# Patient Record
Sex: Male | Born: 1950 | Race: White | Hispanic: No | Smoking: Former smoker
Health system: Southern US, Community
[De-identification: ages and names within clinical notes are randomized; demographics above are authoritative.]

## PROBLEM LIST (undated history)

## (undated) DIAGNOSIS — S82401A Unspecified fracture of shaft of right fibula, initial encounter for closed fracture: Secondary | ICD-10-CM

## (undated) DIAGNOSIS — T7840XA Allergy, unspecified, initial encounter: Secondary | ICD-10-CM

## (undated) DIAGNOSIS — F32A Depression, unspecified: Secondary | ICD-10-CM

## (undated) DIAGNOSIS — M1990 Unspecified osteoarthritis, unspecified site: Secondary | ICD-10-CM

## (undated) DIAGNOSIS — L409 Psoriasis, unspecified: Secondary | ICD-10-CM

## (undated) DIAGNOSIS — F329 Major depressive disorder, single episode, unspecified: Secondary | ICD-10-CM

## (undated) DIAGNOSIS — G4730 Sleep apnea, unspecified: Secondary | ICD-10-CM

## (undated) DIAGNOSIS — G4733 Obstructive sleep apnea (adult) (pediatric): Secondary | ICD-10-CM

## (undated) DIAGNOSIS — S82301A Unspecified fracture of lower end of right tibia, initial encounter for closed fracture: Secondary | ICD-10-CM

## (undated) DIAGNOSIS — I10 Essential (primary) hypertension: Secondary | ICD-10-CM

## (undated) HISTORY — PX: COLONOSCOPY: SHX174

## (undated) HISTORY — DX: Major depressive disorder, single episode, unspecified: F32.9

## (undated) HISTORY — DX: Essential (primary) hypertension: I10

## (undated) HISTORY — DX: Obstructive sleep apnea (adult) (pediatric): G47.33

## (undated) HISTORY — DX: Sleep apnea, unspecified: G47.30

## (undated) HISTORY — DX: Allergy, unspecified, initial encounter: T78.40XA

## (undated) HISTORY — DX: Unspecified osteoarthritis, unspecified site: M19.90

## (undated) HISTORY — DX: Psoriasis, unspecified: L40.9

## (undated) HISTORY — PX: REPLACEMENT TOTAL KNEE: SUR1224

## (undated) HISTORY — DX: Depression, unspecified: F32.A

---

## 1971-02-28 HISTORY — PX: NASAL TURBINATE REDUCTION: SHX2072

## 1994-06-26 ENCOUNTER — Encounter: Payer: Self-pay | Admitting: Internal Medicine

## 1998-07-29 ENCOUNTER — Ambulatory Visit (HOSPITAL_COMMUNITY): Admission: RE | Admit: 1998-07-29 | Discharge: 1998-07-29 | Payer: Self-pay | Admitting: Obstetrics and Gynecology

## 1998-07-29 ENCOUNTER — Encounter: Payer: Self-pay | Admitting: Obstetrics and Gynecology

## 2002-12-15 ENCOUNTER — Encounter: Admission: RE | Admit: 2002-12-15 | Discharge: 2002-12-15 | Payer: Self-pay | Admitting: Dermatology

## 2002-12-15 ENCOUNTER — Encounter: Payer: Self-pay | Admitting: Dermatology

## 2003-10-19 ENCOUNTER — Ambulatory Visit (HOSPITAL_COMMUNITY): Admission: RE | Admit: 2003-10-19 | Discharge: 2003-10-19 | Payer: Self-pay | Admitting: Orthopedic Surgery

## 2003-10-19 ENCOUNTER — Ambulatory Visit (HOSPITAL_BASED_OUTPATIENT_CLINIC_OR_DEPARTMENT_OTHER): Admission: RE | Admit: 2003-10-19 | Discharge: 2003-10-19 | Payer: Self-pay | Admitting: Orthopedic Surgery

## 2005-02-12 ENCOUNTER — Encounter: Admission: RE | Admit: 2005-02-12 | Discharge: 2005-02-12 | Payer: Self-pay | Admitting: Orthopedic Surgery

## 2007-02-28 HISTORY — PX: ROTATOR CUFF REPAIR: SHX139

## 2007-08-05 ENCOUNTER — Encounter (INDEPENDENT_AMBULATORY_CARE_PROVIDER_SITE_OTHER): Payer: Self-pay | Admitting: Sports Medicine

## 2007-08-05 ENCOUNTER — Ambulatory Visit: Payer: Self-pay

## 2007-08-23 ENCOUNTER — Telehealth (INDEPENDENT_AMBULATORY_CARE_PROVIDER_SITE_OTHER): Payer: Self-pay | Admitting: *Deleted

## 2007-08-26 ENCOUNTER — Inpatient Hospital Stay (HOSPITAL_COMMUNITY): Admission: RE | Admit: 2007-08-26 | Discharge: 2007-08-28 | Payer: Self-pay | Admitting: Orthopedic Surgery

## 2008-08-06 ENCOUNTER — Ambulatory Visit: Payer: Self-pay | Admitting: Internal Medicine

## 2008-08-06 DIAGNOSIS — M1990 Unspecified osteoarthritis, unspecified site: Secondary | ICD-10-CM | POA: Insufficient documentation

## 2008-08-06 DIAGNOSIS — F329 Major depressive disorder, single episode, unspecified: Secondary | ICD-10-CM

## 2008-08-06 DIAGNOSIS — I10 Essential (primary) hypertension: Secondary | ICD-10-CM

## 2008-08-06 DIAGNOSIS — R7309 Other abnormal glucose: Secondary | ICD-10-CM

## 2008-08-06 DIAGNOSIS — R9431 Abnormal electrocardiogram [ECG] [EKG]: Secondary | ICD-10-CM

## 2008-08-06 LAB — CONVERTED CEMR LAB
ALT: 29 units/L (ref 0–53)
AST: 26 units/L (ref 0–37)
Albumin: 4 g/dL (ref 3.5–5.2)
Alkaline Phosphatase: 77 units/L (ref 39–117)
BUN: 24 mg/dL — ABNORMAL HIGH (ref 6–23)
Basophils Absolute: 0.1 10*3/uL (ref 0.0–0.1)
Basophils Relative: 1.4 % (ref 0.0–3.0)
Bilirubin Urine: NEGATIVE
Bilirubin, Direct: 0.1 mg/dL (ref 0.0–0.3)
CO2: 27 meq/L (ref 19–32)
Calcium: 8.9 mg/dL (ref 8.4–10.5)
Chloride: 110 meq/L (ref 96–112)
Creatinine, Ser: 1.5 mg/dL (ref 0.4–1.5)
Eosinophils Absolute: 0.1 10*3/uL (ref 0.0–0.7)
Eosinophils Relative: 1.6 % (ref 0.0–5.0)
GFR calc non Af Amer: 51.25 mL/min (ref >60)
Glucose, Bld: 126 mg/dL — ABNORMAL HIGH (ref 70–99)
HCT: 43 % (ref 39.0–52.0)
Hemoglobin, Urine: NEGATIVE
Hemoglobin: 15.2 g/dL (ref 13.0–17.0)
Hgb A1c MFr Bld: 5.6 % (ref 4.6–6.5)
Ketones, ur: NEGATIVE mg/dL
Leukocytes, UA: NEGATIVE
Lymphocytes Relative: 28.6 % (ref 12.0–46.0)
Lymphs Abs: 1.7 10*3/uL (ref 0.7–4.0)
MCHC: 35.3 g/dL (ref 30.0–36.0)
MCV: 89.8 fL (ref 78.0–100.0)
Monocytes Absolute: 0.5 10*3/uL (ref 0.1–1.0)
Monocytes Relative: 8.2 % (ref 3.0–12.0)
Neutro Abs: 3.6 10*3/uL (ref 1.4–7.7)
Neutrophils Relative %: 60.2 % (ref 43.0–77.0)
Nitrite: NEGATIVE
Platelets: 184 10*3/uL (ref 150.0–400.0)
Potassium: 3.9 meq/L (ref 3.5–5.1)
RBC: 4.79 M/uL (ref 4.22–5.81)
RDW: 12.8 % (ref 11.5–14.6)
Sodium: 142 meq/L (ref 135–145)
Specific Gravity, Urine: >=1.03 (ref 1.000–1.030)
TSH: 1.51 microintl units/mL (ref 0.35–5.50)
Total Bilirubin: 0.8 mg/dL (ref 0.3–1.2)
Total Protein, Urine: NEGATIVE mg/dL
Total Protein: 7 g/dL (ref 6.0–8.3)
Urine Glucose: NEGATIVE mg/dL
Urobilinogen, UA: 0.2 (ref 0.0–1.0)
WBC: 6 10*3/uL (ref 4.5–10.5)
pH: 5.5 (ref 5.0–8.0)

## 2008-08-14 ENCOUNTER — Encounter: Payer: Self-pay | Admitting: Internal Medicine

## 2008-08-19 ENCOUNTER — Telehealth (INDEPENDENT_AMBULATORY_CARE_PROVIDER_SITE_OTHER): Payer: Self-pay | Admitting: *Deleted

## 2008-08-20 ENCOUNTER — Ambulatory Visit: Payer: Self-pay

## 2008-08-20 ENCOUNTER — Encounter: Payer: Self-pay | Admitting: Cardiology

## 2008-08-24 ENCOUNTER — Ambulatory Visit: Payer: Self-pay

## 2008-08-24 ENCOUNTER — Ambulatory Visit: Payer: Self-pay | Admitting: Internal Medicine

## 2008-08-25 ENCOUNTER — Encounter: Payer: Self-pay | Admitting: Internal Medicine

## 2008-08-26 ENCOUNTER — Encounter: Payer: Self-pay | Admitting: Internal Medicine

## 2008-08-28 ENCOUNTER — Telehealth: Payer: Self-pay | Admitting: Internal Medicine

## 2008-09-03 ENCOUNTER — Telehealth: Payer: Self-pay | Admitting: Internal Medicine

## 2008-09-07 ENCOUNTER — Encounter: Payer: Self-pay | Admitting: Internal Medicine

## 2008-09-07 ENCOUNTER — Telehealth: Payer: Self-pay | Admitting: Internal Medicine

## 2008-09-14 ENCOUNTER — Inpatient Hospital Stay (HOSPITAL_COMMUNITY): Admission: RE | Admit: 2008-09-14 | Discharge: 2008-09-16 | Payer: Self-pay | Admitting: Orthopedic Surgery

## 2008-09-14 ENCOUNTER — Telehealth: Payer: Self-pay | Admitting: Internal Medicine

## 2008-09-29 ENCOUNTER — Telehealth: Payer: Self-pay | Admitting: Internal Medicine

## 2008-11-16 ENCOUNTER — Ambulatory Visit: Payer: Self-pay | Admitting: Internal Medicine

## 2008-11-16 LAB — CONVERTED CEMR LAB
BUN: 24 mg/dL — ABNORMAL HIGH (ref 6–23)
Bilirubin Urine: NEGATIVE
CO2: 24 meq/L (ref 19–32)
Calcium: 9 mg/dL (ref 8.4–10.5)
Chloride: 108 meq/L (ref 96–112)
Creatinine, Ser: 1.3 mg/dL (ref 0.4–1.5)
GFR calc non Af Amer: 60.39 mL/min (ref >60)
Glucose, Bld: 96 mg/dL (ref 70–99)
Hemoglobin, Urine: NEGATIVE
Hgb A1c MFr Bld: 5.1 % (ref 4.6–6.5)
Ketones, ur: NEGATIVE mg/dL
Leukocytes, UA: NEGATIVE
Nitrite: NEGATIVE
Potassium: 4.1 meq/L (ref 3.5–5.1)
Sodium: 140 meq/L (ref 135–145)
Specific Gravity, Urine: >=1.03 (ref 1.000–1.030)
Total Protein, Urine: NEGATIVE mg/dL
Urine Glucose: NEGATIVE mg/dL
Urobilinogen, UA: 0.2 (ref 0.0–1.0)
pH: 5.5 (ref 5.0–8.0)

## 2008-12-21 ENCOUNTER — Ambulatory Visit: Payer: Self-pay | Admitting: Internal Medicine

## 2008-12-21 DIAGNOSIS — G4733 Obstructive sleep apnea (adult) (pediatric): Secondary | ICD-10-CM

## 2008-12-29 ENCOUNTER — Telehealth: Payer: Self-pay | Admitting: Internal Medicine

## 2008-12-30 ENCOUNTER — Encounter: Payer: Self-pay | Admitting: Internal Medicine

## 2009-02-02 ENCOUNTER — Encounter: Payer: Self-pay | Admitting: Internal Medicine

## 2009-02-08 ENCOUNTER — Telehealth: Payer: Self-pay | Admitting: Internal Medicine

## 2009-06-17 ENCOUNTER — Telehealth: Payer: Self-pay | Admitting: Internal Medicine

## 2009-08-17 ENCOUNTER — Telehealth: Payer: Self-pay | Admitting: Internal Medicine

## 2009-09-23 ENCOUNTER — Telehealth: Payer: Self-pay | Admitting: Internal Medicine

## 2009-11-15 ENCOUNTER — Telehealth: Payer: Self-pay | Admitting: Internal Medicine

## 2009-12-21 ENCOUNTER — Ambulatory Visit: Payer: Self-pay | Admitting: Internal Medicine

## 2009-12-21 DIAGNOSIS — L408 Other psoriasis: Secondary | ICD-10-CM | POA: Insufficient documentation

## 2009-12-21 DIAGNOSIS — G4700 Insomnia, unspecified: Secondary | ICD-10-CM | POA: Insufficient documentation

## 2009-12-22 ENCOUNTER — Encounter: Payer: Self-pay | Admitting: Internal Medicine

## 2010-03-29 NOTE — Progress Notes (Signed)
Summary: lorazepam  Phone Note Refill Request   Refills Requested: Medication #1:  LORAZEPAM 0.5 MG TABS 1-2 by mouth two times a day as needed anxiety or insomnia   Supply Requested: 3 months Initial call taken by: Lamar Sprinkles, CMA,  June 17, 2009 10:51 AM  Follow-up for Phone Call        ok Follow-up by: Etta Grandchild MD,  June 17, 2009 11:07 AM  Additional Follow-up for Phone Call Additional follow up Details #1::        Rx called to pharmacy, spoke wuith alex/pharmacist Additional Follow-up by: Orlan Leavens,  June 17, 2009 2:33 PM    Prescriptions: LORAZEPAM 0.5 MG TABS (LORAZEPAM) 1-2 by mouth two times a day as needed anxiety or insomnia  #50 x 2   Entered by:   Orlan Leavens   Authorized by:   Etta Grandchild MD   Signed by:   Orlan Leavens on 06/17/2009   Method used:   Telephoned to ...       [facility]  [address] [id number]       [address]       [place], [place]  [address]       Ph: [phone] or [phone]       Fax: [phone]   RxID:   [id number]

## 2010-03-29 NOTE — Progress Notes (Signed)
  Phone Note Refill Request Message from:  Fax from Pharmacy on September 23, 2009 4:00 PM  Refills Requested: Medication #1:  LORAZEPAM 0.5 MG TABS 1-2 by mouth two times a day as needed anxiety or insomnia   Dosage confirmed as above?Dosage Confirmed   Supply Requested: 1 month   Last Refilled: 08/19/2009  Is this ok to refill for pt  [facility] [address]   Method Requested: Telephone to Pharmacy Initial call taken by: Rock Nephew CMA,  September 23, 2009 4:01 PM  Follow-up for Phone Call        yes Follow-up by: Etta Grandchild MD,  September 23, 2009 4:28 PM

## 2010-03-29 NOTE — Progress Notes (Signed)
Summary: RF  Phone Note Refill Request   Refills Requested: Medication #1:  LORAZEPAM 0.5 MG TABS 1-2 by mouth two times a day as needed anxiety or insomnia Patient is requesting refill for 90 day supply. Wants #90, he takes only one daily. OK?   Initial call taken by: Lamar Sprinkles, CMA,  November 15, 2009 11:55 AM  Follow-up for Phone Call        ok Follow-up by: Etta Grandchild MD,  November 15, 2009 12:27 PM  Additional Follow-up for Phone Call Additional follow up Details #1::        Pt informed  Additional Follow-up by: Lamar Sprinkles, CMA,  November 15, 2009 3:25 PM    Prescriptions: LORAZEPAM 0.5 MG TABS (LORAZEPAM) 1-2 by mouth two times a day as needed anxiety or insomnia  #90 x 0   Entered by:   Lamar Sprinkles, CMA   Authorized by:   Etta Grandchild MD   Signed by:   Lamar Sprinkles, CMA on 11/15/2009   Method used:   Telephoned to ...       [facility]  [address] [id number]       [address]       [place], [place]  [address]       Ph: [phone] or [phone]       Fax: [phone]   RxID:   [id number]

## 2010-03-29 NOTE — Assessment & Plan Note (Signed)
Summary: 12 months/apc   Primary Brenon Antosh/Referring Rafferty Postlewait:  Etta Grandchild MD  CC:  1 year follow up, complaiant with cpap averages 6 hrs per night, pt c/o waking 2 or 3 times a night, and pt has been out of lorazepam x 3 monthsr.  History of Present Illness: History of Present Illness: December 21, 2008- 58 yoM started CPAP for sleep apnea years ago at the old office. He thinks pressure is on 13. Uses CPAP everynight and he is not told he snores through. He denies daytime sleepiness. Now his old cpap is worn out and difficult to travel with.  NPSG 06/26/94- RDI 35/hr Bedtime 10-11PM, sleep latency 15 minutes, up 3-4 times before waking 532-6AM.  Has lost 20 lb in recent years, Sleeps on Tempurpedic mattress. Has hypertension, no cardiac, neurologic or thyroid disorder. Has had ENT surgery- turbinate reduction.  December 21, 2009- OSA Nurse-CC: 1 year follow up, compliant with cpap averages 6 hrs per night, pt c/o waking 2 or 3 times a night, pt has been out of lorazepam x 3 months. He has trouble getting it refilled on time. He was taking Lorazepam 1/2 x 0.5 mg in the morning with other meds. He doesn't feel he needs it in daytime and asks if he could change to take it for sleep consolidation at night. He has been waking spontaneously 2-3 x/ night then returns readily to sleep. Bedtime 10PM, up at 5AM. Feels tired w/ slow starts in AM.  CPAP pressure remains at 13. He thinks CPAP is fine and wife isn't hearing snore or noting leg jerks.  Declines flu vax.      Preventive Screening-Counseling & Management  Alcohol-Tobacco     Smoking Status: quit     Packs/Day: 0.75     Year Started: 1968     Year Quit: 1999  Current Medications (verified): 1)  Lisinopril-Hydrochlorothiazide 20-12.5 Mg Tabs (Lisinopril-Hydrochlorothiazide) .... Take 1 Tablet By Mouth Once A Day 2)  Lorazepam 0.5 Mg Tabs (Lorazepam) .Marland Kitchen.. 1-2 By Mouth Two Times A Day As Needed Anxiety or Insomnia 3)  Meloxicam 15 Mg  Tabs (Meloxicam) .... Take 1 Tablet By Mouth Once A Day 4)  Enbrel 50 Mg/ml Soln (Etanercept) .... Twice Weekly 5)  Wellbutrin Xl 150 Mg Xr24h-Tab (Bupropion Hcl) .... 3 Once Daily 6)  Cpap .... Set 13 Ahc  Allergies (verified): No Known Drug Allergies  Past History:  Past Medical History: Last updated: 12/21/2008 Depression Hypertension OSA- NPSG 06/26/94- RDI 35/hr Psoriasis Osteoarthritis  Past Surgical History: Last updated: 12/21/2008 Total knee replacement-x 2 Rotator cuff repair Nasal turbinate reduction  Family History: Last updated: 12/21/2008 Family History of Arthritis Family History Hypertension Family History Lung cancer Father- died lymphoma, emphysema  Social History: Last updated: 12/21/2008 Occupation: Merchandiser, retail [employer] Married Alcohol use-yes Drug use-no Regular exercise-no Patient states former smoker. Quit 1999  Risk Factors: Alcohol Use: <1 (08/06/2008) >5 drinks/d w/in last 3 months: no (08/06/2008) Exercise: no (08/06/2008)  Risk Factors: Smoking Status: quit (12/21/2009) Packs/Day: 0.75 (12/21/2009)  Social History: Packs/Day:  0.75  Review of Systems      See HPI  The patient denies shortness of breath with activity, shortness of breath at rest, productive cough, non-productive cough, coughing up blood, chest pain, irregular heartbeats, acid heartburn, indigestion, loss of appetite, weight change, abdominal pain, difficulty swallowing, sore throat, tooth/dental problems, headaches, nasal congestion/difficulty breathing through nose, and sneezing.    Vital Signs:  Patient profile:   60 year old male Height:  75 inches Weight:      351.6 pounds BMI:     44.11 O2 Sat:      98 % on Room air Pulse rate:   76 / minute BP sitting:   138 / 78  (left arm) Cuff size:   large  Vitals Entered By: Renold Genta RCP, LPN (December 21, 2009 9:21 AM)  O2 Flow:  Room air CC: 1 year follow up, complaiant with cpap averages 6 hrs  per night, pt c/o waking 2 or 3 times a night, pt has been out of lorazepam x 3 monthsr Is Patient Diabetic? No Comments Medications reviewed with patient Renold Genta RCP, LPN  December 21, 2009 9:26 AM    Physical Exam  Additional Exam:  General: A/Ox3; pleasant and cooperative, NAD, obese, tall SKIN: no rash, lesions NODES: no lymphadenopathy HEENT: [place]/AT, EOM- WNL, Conjuctivae- clear, PERRLA, TM-WNL, Nose- clear, Throat- clear and wnl, Mallampati  III-IV NECK: Supple w/ fair ROM, JVD- none, normal carotid impulses w/o bruits Thyroid- normal to palpation CHEST: Clear to P&A HEART: RRR, no m/g/r heard ABDOMEN: Soft and nl;  UVO:ZDGU, nl pulses, no edema  NEURO: Grossly intact to observation      Impression & Recommendations:  Problem # 1:  HYPERSOMNIA, ASSOCIATED WITH SLEEP APNEA (ICD-780.53)  Clinically his CPAP compliance and control seem good. We will ask [facility] -handwritten- to do a card download so we can see where we are. We can change the pressure if needed. Weight loss would help.   Problem # 2:  INSOMNIA (ICD-780.52)  There is an insomnia component with waking after sleep onset. I am not sure it will need much, and we discussed sleep hygiene. Since he has had lorazepam, originally prescribed for anxiety/ depression and then for sleep, I will start with that to see if it helps with sleep consolidation as discussed.   Problem # 3:  PSORIASIS (ICD-696.1)  He remains on immunosupressive treatment for his psoriasis. This has improved his comfort and is not obvioulsy impacting sleep now.   Orders: Est. Patient Level IV (44034)  Medications Added to Medication List This Visit: 1)  Lorazepam 0.5 Mg Tabs (Lorazepam) .Marland Kitchen.. 1-2  at bedtime if needed for insomnia  Patient Instructions: 1)  Please schedule a follow-up appointment in 6 months. 2)  Ask [facility] to help update a download on on your CPAP for pressure reassessment. 3)  Script for lorazepam to help with sleep.  Don't take it if you don't need it- that will delay getting tolerant to where it doesn't work as well.  Prescriptions: LORAZEPAM 0.5 MG TABS (LORAZEPAM) 1-2  at bedtime if needed for insomnia  #180 x 3   Entered and Authorized by:   Waymon Budge MD   Signed by:   Waymon Budge MD on 12/21/2009   Method used:   Print then Give to Patient   RxID:   [id number]

## 2010-03-29 NOTE — Progress Notes (Signed)
Summary: 90day supply corrections  ---- Converted from flag ---- ---- 08/17/2009 9:35 AM, Etta Grandchild MD wrote: ok  ---- 08/17/2009 8:30 AM, Rock Nephew CMA wrote: Per pharmacy, pt states he takes Take 1 tablet by mouth once a day. #50 does not last pt 90days. Is it ok to fill 50 for a 50day supply ------------------------------  Phone Note From Pharmacy   Summary of Call: pharmacist notified via fax per MD ok to update #50tabs to last [facility]day[phone] Initial call taken by: Rock Nephew CMA,  August 17, 2009 11:39 AM

## 2010-06-03 ENCOUNTER — Other Ambulatory Visit: Payer: Self-pay | Admitting: Internal Medicine

## 2010-06-05 LAB — DIFFERENTIAL
Basophils Absolute: 0 10*3/uL (ref 0.0–0.1)
Lymphocytes Relative: 27 % (ref 12–46)
Neutro Abs: 4.6 10*3/uL (ref 1.7–7.7)
Neutrophils Relative %: 62 % (ref 43–77)

## 2010-06-05 LAB — BASIC METABOLIC PANEL
BUN: 27 mg/dL — ABNORMAL HIGH (ref 6–23)
CO2: 26 mEq/L (ref 19–32)
CO2: 27 mEq/L (ref 19–32)
Calcium: 8.2 mg/dL — ABNORMAL LOW (ref 8.4–10.5)
Chloride: 103 mEq/L (ref 96–112)
Creatinine, Ser: 1.66 mg/dL — ABNORMAL HIGH (ref 0.4–1.5)
GFR calc Af Amer: 52 mL/min — ABNORMAL LOW (ref >60)
GFR calc non Af Amer: 43 mL/min — ABNORMAL LOW (ref >60)
GFR calc non Af Amer: 56 mL/min — ABNORMAL LOW (ref >60)
Glucose, Bld: 111 mg/dL — ABNORMAL HIGH (ref 70–99)
Glucose, Bld: 112 mg/dL — ABNORMAL HIGH (ref 70–99)
Potassium: 4.1 mEq/L (ref 3.5–5.1)
Potassium: 4.6 mEq/L (ref 3.5–5.1)
Sodium: 136 mEq/L (ref 135–145)
Sodium: 136 mEq/L (ref 135–145)

## 2010-06-05 LAB — COMPREHENSIVE METABOLIC PANEL
Albumin: 4.1 g/dL (ref 3.5–5.2)
BUN: 27 mg/dL — ABNORMAL HIGH (ref 6–23)
CO2: 26 mEq/L (ref 19–32)
Chloride: 105 mEq/L (ref 96–112)
Creatinine, Ser: 1.68 mg/dL — ABNORMAL HIGH (ref 0.4–1.5)
GFR calc non Af Amer: 42 mL/min — ABNORMAL LOW (ref >60)
Glucose, Bld: 97 mg/dL (ref 70–99)
Total Bilirubin: 0.4 mg/dL (ref 0.3–1.2)

## 2010-06-05 LAB — CBC
HCT: 33.3 % — ABNORMAL LOW (ref 39.0–52.0)
HCT: 36.8 % — ABNORMAL LOW (ref 39.0–52.0)
HCT: 44.5 % (ref 39.0–52.0)
Hemoglobin: 11.5 g/dL — ABNORMAL LOW (ref 13.0–17.0)
Hemoglobin: 12.7 g/dL — ABNORMAL LOW (ref 13.0–17.0)
MCHC: 34.4 g/dL (ref 30.0–36.0)
MCV: 91.2 fL (ref 78.0–100.0)
MCV: 90.8 fL (ref 78.0–100.0)
Platelets: 163 10*3/uL (ref 150–400)
Platelets: 192 10*3/uL (ref 150–400)
RBC: 4.04 MIL/uL — ABNORMAL LOW (ref 4.22–5.81)
RDW: 13.6 % (ref 11.5–15.5)
RDW: 13.7 % (ref 11.5–15.5)
WBC: 10 10*3/uL (ref 4.0–10.5)
WBC: 7.4 10*3/uL (ref 4.0–10.5)

## 2010-06-05 LAB — URINALYSIS, ROUTINE W REFLEX MICROSCOPIC
Bilirubin Urine: NEGATIVE
Ketones, ur: NEGATIVE mg/dL
Nitrite: NEGATIVE
Protein, ur: NEGATIVE mg/dL

## 2010-06-05 LAB — PROTIME-INR
INR: 1.1 (ref 0.00–1.49)
Prothrombin Time: 15.1 seconds (ref 11.6–15.2)
Prothrombin Time: 14.3 seconds (ref 11.6–15.2)

## 2010-06-05 LAB — APTT: aPTT: 33 seconds (ref 24–37)

## 2010-06-05 LAB — TYPE AND SCREEN
ABO/RH(D): A POS
Antibody Screen: NEGATIVE

## 2010-06-05 LAB — URINE CULTURE: Culture: NO GROWTH

## 2010-06-21 ENCOUNTER — Ambulatory Visit (INDEPENDENT_AMBULATORY_CARE_PROVIDER_SITE_OTHER): Payer: 59 | Admitting: Internal Medicine

## 2010-06-21 ENCOUNTER — Encounter: Payer: Self-pay | Admitting: Internal Medicine

## 2010-06-21 VITALS — BP 116/76 | HR 74 | Ht 75.0 in | Wt 335.8 lb

## 2010-06-21 DIAGNOSIS — G4730 Sleep apnea, unspecified: Secondary | ICD-10-CM

## 2010-06-21 DIAGNOSIS — G4700 Insomnia, unspecified: Secondary | ICD-10-CM

## 2010-06-21 DIAGNOSIS — G4710 Hypersomnia, unspecified: Secondary | ICD-10-CM

## 2010-06-21 NOTE — Assessment & Plan Note (Signed)
Good compliance and control. Weight loss is encouraged. We will continue present pressure for now, but consider a step up in the futrure. Encouraged to get enough sleep.

## 2010-06-21 NOTE — Progress Notes (Signed)
  Subjective:    Patient ID: Steven Martin, male    DOB: [birth year], 60 y.o.   MRN: [id number]  HPI 60 yo M followed for obstructive sleep apnea complicated by insomnia and hypertension. He hasn't needed ativan much at all. He gets up at 5 AM now and has little trouble falling asleep at night around 10-11PM. We discussed metabolic effects of insufficient sleep. He feels comfortable with nasal pillows mask, [facility], 13 cwp. Downloaded today- used 91 of days > 4 hours, with AHI 7.6.    Review of Systems See HPI no tireder than he is used to.    Constitutional:   No weight loss, night sweats,  Fevers, chills, fatigue, lassitude. HEENT:   No headaches,  Difficulty swallowing,  Tooth/dental problems,  Sore throat,                No sneezing, itching, ear ache, nasal congestion, post nasal drip,   CV:  No chest pain,  Orthopnea, PND, swelling in lower extremities, anasarca, dizziness, palpitations  GI  No heartburn, indigestion, abdominal pain, nausea, vomiting, diarrhea, change in bowel habits, loss of appetite  Resp: No shortness of breath with exertion or at rest.  No excess mucus, no productive cough,  No non-productive cough,  No coughing up of blood.  No change in color of mucus.  No wheezing. Skin: no rash or lesions.  GU: no dysuria, change in color of urine, no urgency or frequency.  No flank pain.  MS:  No joint pain or swelling.  No decreased range of motion.  No back pain.  Psych:  No change in mood or affect. No depression or anxiety.  No memory loss.   Objective:   Physical Exam General- Alert, Oriented, Affect-appropriate, Distress- none acute      obese  Skin- , lesions- none, excoriation- none   Psoriasis left knuckles  Lymphadenopathy- none  Head- atraumatic  Eyes- Gross vision intact, PERRLA, conjunctivae clear, secretions  Ears-Normal- Hearing, canals, Tm L ,   R ,  Nose- Clear, No-Septal dev, mucus, polyps, erosion, perforation   Throat- Mallampati IV ,  mucosa clear , drainage- none, tonsils- atrophic  Neck- flexible , trachea midline, no stridor , thyroid nl, carotid no bruit  Chest - symmetrical excursion , unlabored     Heart/CV- RRR , no murmur , no gallop  , no rub, nl s1 s2                     - JVD- none , edema- none, stasis changes- none, varices- none     Lung- clear to P&A, wheeze- none, cough- none , dullness-none, rub- none     Chest wall- Abd- tender-no, distended-no, bowel sounds-present, HSM- no  Br/ Gen/ Rectal- Not done, not indicated  Extrem- cyanosis- none, clubbing, none, atrophy- none, strength- nl  Neuro- grossly intact to observation         Assessment & Plan:

## 2010-06-21 NOTE — Patient Instructions (Signed)
We will continue your CPAP pressure at 13 for now,  But may consider trying to step it up in the future.   CPAP is not a replacement for allowing yourself enough sleep time   Nonsedating antihistamines like claritin or Allegra are fine if needed during allergy season

## 2010-06-23 ENCOUNTER — Encounter: Payer: Self-pay | Admitting: Internal Medicine

## 2010-06-23 NOTE — Assessment & Plan Note (Signed)
Occasional nights when sleep onset is difficult, within adult norms. We discussed sleep hygiene and available support.

## 2010-07-12 NOTE — Op Note (Signed)
NAME:  Steven Martin, Steven Martin NO.:  [id number]   MEDICAL RECORD NO.:  [id number]          PATIENT TYPE:  INP   LOCATION:  NA                           FACILITY:  [facility]   PHYSICIAN:  Robert A. Thurston Hole, M.D. DATE OF BIRTH:  [birth year]   DATE OF PROCEDURE:  08/26/2007  DATE OF DISCHARGE:                               OPERATIVE REPORT   PREOPERATIVE DIAGNOSIS:  Right knee degenerative joint disease.   POSTOPERATIVE DIAGNOSIS:  Right knee degenerative joint disease.   PROCEDURE:  Right total knee replacement using DePuy cemented total knee  system #5, cemented femur #5, cemented tibia with 12.5 mm polyethylene  RP tibial spacer and 35-mm polyethylene cemented patella.   SURGEON:  Nilda Simmer, M.D.   ASSISTANT:  Veverly Fells, P.A.   ANESTHESIA:  General.   OPERATIVE TIME:  1 hour 20 minutes.   COMPLICATIONS:  None.   DESCRIPTION OF PROCEDURE:  Steven Martin was brought into the operating  room on August 26, 2007, after femoral nerve block was placed in the  holding area by anesthesia.  He was placed on the operative table in  supine position.  He received Ancef 2 g IV preoperatively for  prophylaxis.  After being placed under general anesthesia, a Foley  catheter placed under sterile conditions.  His right knee was examined.  Range of motion from -5 to 125 degrees of varus deformity of knee.  Stable ligamentous exam with normal patella tracking.  The right leg was  prepped using sterile DuraPrep and draped using sterile technique.  The  leg was exsanguinated and the thigh-high tourniquet elevated 365 mm.  Initially, through a 15-cm longitudinal incision based over the patella,  initial exposure was made.  The underlying subcutaneous tissues were  incised along with skin incision.  A median arthrotomy was made  revealing an excessive amount of normally-appearing joint fluid.  The  articular surfaces were inspected.  He had grade 4 changes medially,  laterally, and  in the patellofemoral joint.  Osteophytes were removed  from the femoral condyles and tibial plateau.  The medial and lateral  meniscal remnants were removed as well as the anterior cruciate  ligament.  The intramedullary drill was then drilled up the femoral  canal for placement of distal femoral cutting jig, which is placed in  the appropriate manner of rotation and then distally an 11 mm cut was  made.  The distal femur was then sized.  A #5 was found to be the  appropriate size.  A #5 cutting jig was placed in the appropriate manner  of external rotation and then these cuts were made.  The proximal tibia  was then exposed.  The tibial spines were removed with an oscillating  saw.  Each medullary drill was drilled out in the tibial canal after  placement of proximal tibial cutting jig, which is placed in the  appropriate manner of rotation.  A proximal 6 mm cut was made based up  the medial or lower side.  Spacer blocks were then placed in flexion and  extension.  A 12.5 mm blocks  give excellent balancing, excellent  stability, and excellent correction of his flexion and varus  deformities.  At this point, the #5 tibial base plate trial was placed  on the cut tibial surface with an excellent fit and a keel cut was made.  After this was done, the PCL box cutter was placed on the distal femur  and these cut were made.  At this point, the #5 femoral trial placed,  and with the #5 tibia base plate trial and a 12.5 mm polyethylene RP  tibial spacer, the knee was reduced taking through a range of motion  from 0 to 125 degrees with excellent stability and excellent correction  of his flexion and varus deformities and normal patella tracking.  The  patella was incised and a 10 mm resurfacing cut was made and 3 locking  holes were placed for 35 mm patella trial.  The trial was placed.  Again, patellofemoral tracking was evaluated and found to be normal.  At  this point, I felt that all the trial  components have excellent size,  fit, and stability.  They were then removed.  The knee was then jet  lavaged, irrigated with 3 liters of saline.  The proximal tibia was then  exposed and the #5 tibial base plate was cemented back and was hammered  into position with an excellent fit, with excess cement being removed  from around the edges.  A #5 femoral component was cemented back and  hammered into position also with an excellent fit with excess cement  being removed from around the edges.  The 12.5 mm polyethylene RP tibial  spacer was placed on tibial base plate.  The knee reduced taking to full  range of motion, found to be stable, leg lengths equal with excellent  correction with flexion of varus deformities.  The 35-mm polyethylene  cemented back patella was then placed in its position and held there  with a clamp.  After the cement hardened, the knee was further irrigated  with saline.  The tourniquet was then released.  Hemostasis was obtained  with cautery.  It was then felt that all the component were of excellent  size, fit, and stability.  The arthrotomy was then closed with #1  Ethibond suture with 2 medium Hemovac drains.  Subcutaneous tissue was  closed with closed with 0 and 2-0 Vicryl, subcuticular area closed with  4-0 Monocryl.  Sterile dressings and long leg splint applied.  The  patient then awakened, extubated, and taken to the recovery room in  stable condition.  Needle and sponge count was correct x2 at the end of  the case.  At the end of the case, neurovascular status was normal as  well.      Robert A. Thurston Hole, M.D.  Electronically Signed     RAW/MEDQ  D:  08/26/2007  T:  08/26/2007  Job:  [id number]

## 2010-07-12 NOTE — Op Note (Signed)
NAME:  Steven Martin, Steven Martin NO.:  [id number]   MEDICAL RECORD NO.:  [id number]          PATIENT TYPE:  INP   LOCATION:  5036                         FACILITY:  [facility]   PHYSICIAN:  Robert A. Thurston Hole, M.D. DATE OF BIRTH:  08-29-1950   DATE OF PROCEDURE:  09/14/2008  DATE OF DISCHARGE:                               OPERATIVE REPORT   PREOPERATIVE DIAGNOSIS:  Left knee degenerative joint disease.   POSTOPERATIVE DIAGNOSIS:  Left knee degenerative joint disease.   PROCEDURE:  Left total knee replacement using DePuy cemented total knee  system with #5 cemented femur, #5 cemented tibia with 17.5-mm  polyethylene RP tibial spacer and 38-mm polyethylene cemented patella.   SURGEON:  Elana Alm. Thurston Hole, MD   ASSISTANT:  Julien Girt, PA   ANESTHESIA:  General.   OPERATIVE TIME:  1 hour 30 minutes.   COMPLICATIONS:  None.   DESCRIPTION OF PROCEDURE:  Steven Martin was brought to the operating room  on September 14, 2008, after a femoral nerve block was placed in the holding  by anesthesia.  He was placed on the operative table in the supine  position.  He received vancomycin 1 g IV preoperatively for prophylaxis.  After being placed under general anesthesia, he had a Foley catheter  placed under sterile conditions.  His left knee was examined.  Range of  motion from -5 to 125 degrees, moderate varus deformity, knee stable  ligamentous exam with normal patellar tracking.  Left leg was prepped  using sterile DuraPrep and draped using sterile technique.  A time-out  technique was used diligently.  The left leg was then prepped using  sterile DuraPrep and draped.  After being prepped and draped, the left  leg was elevated and exsanguinated and tourniquet was elevated to 365  mm.  Initially, through a 15-cm longitudinal incision based over the  patella, initial exposure was made.  Then, subcutaneous tissues were incised along with skin incision.  A  median arthrotomy was  performed revealing an excessive amount of normal-  appearing joint fluid.  The articular surfaces were inspected.  He had  grade 4 changes medially and laterally in the patellofemoral joint.  Osteophytes were removed from the femoral condyles and tibial plateau.  The medial and lateral meniscal remnants were removed as well as the  anterior cruciate ligament.  An intramedullary drill was then drilled up  the femoral canal for placement of distal femoral cutting jig which was  placed in the appropriate amount of rotation and a distal 13-mm cut was  made.  The distal femur was incised.  The #5 was found to be the  appropriate size.  The #5 cutting jig was then placed in the appropriate  amount of external rotation and then these cuts were made.  The proximal  tibia was then exposed.  The tibial spines were removed with an  oscillating saw.  Intramedullary drill was drilled down the tibial canal  for placement of proximal tibial cutting jig which was placed in the  appropriate amount of rotation and a proximal 6-mm cut was made based  off  the medial or lower side.  Spacer blocks were then placed in flexion  and extension.  The 17.5 mm blocks gave excellent balancing, excellent  stability, and excellent correction of his flexion and varus  deformities.  At this point, the #5 tibial baseplate trial was placed on  the cut tibial surface with an excellent fit and the keel cut was made.  The PCL box cutter was placed on the distal femur and these cuts were  made.  After this was done, the #5 femoral component was placed with the  #5 tibial baseplate component and a 17.5-mm polyethylene RP tibial  spacer.  The knee was reduced and taken through range of motion from 0-  125 degrees with excellent stability and excellent correction of his  flexion and varus deformities and normal patellar tracking.  A  resurfacing 9-mm cut was then made on the patella and 3 locking holes  placed for a 38-mm patellar  trial which was then placed and again  patellofemoral tracking was evaluated and found to be normal.  At this  point, it was felt that all the trial components were of excellent size,  fit, and stability.  They were then removed.  The knee was then jet  lavage irrigated with 3 L of saline.  The proximal tibia was then  exposed.  The #5 tibial baseplate with cemented back and was hammered  into position with an excellent fit with excess cement being removed  from around the edges.  The #5 femoral component with cement backing was  hammered into position, also with an excellent fit with excess cement  being removed from around the edges.  The 17.5-mm polyethylene RP tibial  spacer was placed on tibial baseplate.  The knee was reduced and taken  through range of motion from 0-125 degrees with excellent stability and  excellent correction of his flexion and varus deformities.  The 38-mm  polyethylene cement backed patella was then placed in its position and  held there with a clamp.  After the cement hardened, again  patellofemoral tracking was evaluated and found to be normal.  At this  point, it was felt that all the components were of excellent size, fit,  and stability.  The wound was further irrigated with saline and then the  tourniquet was released.  Hemostasis was obtained with cautery.  The  arthrotomy was then closed with #1 Ethibond suture over 2 medium Hemovac  drains.  Subcutaneous tissues were closed with 0 and 2-0 Vicryl.  Subcuticular layer was closed with 4-0 Monocryl.  Sterile dressings and  a long-leg splint were applied.  Then, the patient was awakened,  extubated, and taken to the recovery in stable condition.  Needle and  sponge count was correct x2 at the end of the case.  Neurovascular  status was normal postoperatively.  Pulses were 2+ and symmetric.      Robert A. Thurston Hole, M.D.  Electronically Signed     RAW/MEDQ  D:  09/14/2008  T:  09/15/2008  Job:  [id number]

## 2010-07-15 NOTE — Op Note (Signed)
NAME:  Steven Martin, Steven Martin                          ACCOUNT NO.:  [id number]   MEDICAL RECORD NO.:  [id number]                   PATIENT TYPE:  AMB   LOCATION:  DSC                                  FACILITY:  [facility]   PHYSICIAN:  Robert A. Thurston Hole, M.D.              DATE OF BIRTH:  Jul 26, 1950   DATE OF PROCEDURE:  10/19/2003  DATE OF DISCHARGE:                                 OPERATIVE REPORT   PREOPERATIVE DIAGNOSIS:  1. 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  2. Left knee medial and lateral meniscal tears with chondromalacia and     synovitis/degenerative joint disease.   POSTOPERATIVE DIAGNOSIS:  1. 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  2. Left knee medial and lateral meniscal tears with chondromalacia and     synovitis/degenerative joint disease.   OPERATION PERFORMED:  1. Right knee examination under anesthesia followed by arthroscopic partial     medial and lateral meniscectomies.  2. Right knee chondroplasty with partial synovectomy.  3. Left knee examination under anesthesia followed by arthroscopic partial     and lateral meniscectomies.  4. Left knee chondroplasty with partial synovectomy.   SURGEON:  Elana Alm. Thurston Hole, M.D.   ASSISTANT:  Julien Girt, P.A.   ANESTHESIA:  General.   OPERATIVE TIME:  One hour.   COMPLICATIONS:  Nonen.   INDICATIONS FOR PROCEDURE:  Mr. Pavon is a 60 year old gentleman who has  had significant bilateral knee pain for the past six to eight months,  increasing in nature.  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  He has failed conservative care.  Exam and MRI has  revealed medial meniscal tears with chondromalacia and synovitis and he is  now to undergo arthroscopy.   DESCRIPTION OF PROCEDURE:  Mr. Dermody was brought to the operating room on  October 19, 2003, placed on the operating table in supine position.  He  had  bilateral  psoriatic lesions on the anterior aspect of his knees but only in  the center, not where the arthroscopic portals were going to be made.  There  were no signs of infection and thus I elected to proceed with arthroscopy  covering him with preoperative antibiotics.  Both knees were sterilely  injected with 0.25% Marcaine with epinephrine.  Both knees range of motion  showed 0 to 125 degrees with 2% crepitation.  Knees were stable to  ligamentous exam with normal patellar tracking.  Both knees were then  prepped using Betadine scrub and DuraPrep and draped using sterile  technique.  Originally, the right knee was addressed through an  anterolateral portal.  The arthroscope with a pump attached was placed and  through an anteromedial portal, an arthroscopic probe was placed.  On  initial inspection, the medial compartment was found to have 75% grade 3  chondromalacia which was debrided.  Medial meniscal tear posterior medial  horn of which 50% to 60% was resected back to a stable rim.  Intercondylar  notch inspected and anterior and posterior cruciate ligaments were normal.  Lateral compartment was inspected.  30% to 40% grade 3 chondromalacia which  was debrided.  Lateral meniscus tear 20% posterior and lateral corner which  was resected back to a stable rim.  Patellofemoral joint showed 50% grade 3  and 25% grade 4 chondromalacia  which was debrided.  The patella tracked  normally.  Significant synovitis in the medial lateral gutters was debrided,  otherwise, they were free of pathology.  After this was done, it was felt  that all the pathology in the right knee had been satisfactorily addressed.  The instruments were removed.  Portals were closed with 3-0 nylon suture and  injected with 0.25% Marcaine with epinephrine and 4 mg of morphine.  Sterile  dressings were applied.   Attention then turned to the left knee.  An anterolateral portal was made  and the arthroscope with  a pump attached was placed and through an  anteromedial portal an arthroscopic probe was placed.  On initial inspection  of the medial compartment, he was found to have 75% grade 3 chondromalacia  and this was debrided.  The medial meniscus showed tearing of the posterior  medial horn of which 50% to 75% was resected back to a stable rim.  Intercondylar notch was inspected and the anterior and posterior cruciate  ligaments were normal.  Lateral compartment inspected.  25% to 30% grade 3  chondromalacia which was debrided.  Lateral meniscal tear 20% posterior and  lateral horn which was resected back to a stable rim.  Patellofemoral joint  showed 50% grade 3 and 25% grade 4 chondromalacia and degenerative joint  disease and this was debrided.  The patella tracked normally.  Significant  synovitis in the medial and lateral gutters was debrided.  Otherwise they  were free of pathology.  After this was done, it was felt that all pathology  had been satisfactorily addressed.  The instruments were removed.  Portals  were closed with 3-0 nylon suture and injected with 0.25% Marcaine with  epinephrine and 4 mg of morphine.  Sterile dressing applied and the patient  awakened and taken to the recovery room in stable condition.   FOLLOW UP:  Mr. Hink will be followed as an overnight recovery care  patient for intravenous pain control, neurovascular status checks as well as  use of a CPAP machine for sleep apnea.  He will be discharged tomorrow on  Percocet and Voltaren.  See him back in the office in a week for suture  removal and follow-up.                                               Robert A. Thurston Hole, M.D.    RAW/MEDQ  D:  10/19/2003  T:  10/19/2003  Job:  [id number]

## 2010-11-24 LAB — DIFFERENTIAL
Basophils Absolute: 0.1
Basophils Relative: 1
Eosinophils Absolute: 0.1
Eosinophils Relative: 1
Lymphs Abs: 2.2
Neutrophils Relative %: 60

## 2010-11-24 LAB — URINALYSIS, ROUTINE W REFLEX MICROSCOPIC
Bilirubin Urine: NEGATIVE
Glucose, UA: NEGATIVE
Glucose, UA: NEGATIVE
Hgb urine dipstick: NEGATIVE
Ketones, ur: NEGATIVE
Protein, ur: NEGATIVE
Protein, ur: NEGATIVE
Urobilinogen, UA: 0.2
pH: 5.5

## 2010-11-24 LAB — COMPREHENSIVE METABOLIC PANEL
ALT: 31
CO2: 26
Calcium: 9.4
Chloride: 102
GFR calc non Af Amer: 50 — ABNORMAL LOW
Glucose, Bld: 111 — ABNORMAL HIGH
Sodium: 136
Total Bilirubin: 0.7

## 2010-11-24 LAB — CBC
HCT: 32.9 — ABNORMAL LOW
Hemoglobin: 15.4
MCHC: 34.3
MCHC: 34.6
MCV: 90.8
MCV: 91.6
MCV: 90.9
Platelets: 146 — ABNORMAL LOW
RBC: 3.98 — ABNORMAL LOW
RBC: 4.93
WBC: 11.7 — ABNORMAL HIGH
WBC: 8.1

## 2010-11-24 LAB — BASIC METABOLIC PANEL
BUN: 16
CO2: 27
Chloride: 103
Chloride: 102
Creatinine, Ser: 1.35
Creatinine, Ser: 1.19
GFR calc Af Amer: >60
Glucose, Bld: 108 — ABNORMAL HIGH
Potassium: 4

## 2010-11-24 LAB — PROTIME-INR
INR: 1
Prothrombin Time: 13.6
Prothrombin Time: 17.2 — ABNORMAL HIGH

## 2010-11-24 LAB — URINE CULTURE: Colony Count: NO GROWTH

## 2011-01-17 ENCOUNTER — Ambulatory Visit (INDEPENDENT_AMBULATORY_CARE_PROVIDER_SITE_OTHER): Payer: 59 | Admitting: Vascular Surgery

## 2011-01-17 DIAGNOSIS — R609 Edema, unspecified: Secondary | ICD-10-CM

## 2011-01-27 NOTE — Procedures (Unsigned)
DUPLEX DEEP VENOUS EXAM - LOWER EXTREMITY  INDICATION:  Right lower extremity edema.  HISTORY:  Edema:  Yes. Trauma/Surgery:  No. Pain:  No. PE:  No. Previous DVT:  No. Anticoagulants:  No. Other:  Recent travel.  DUPLEX EXAM:               CFV   SFV   PopV  PTV    GSV               R  L  R  L  R  L  R   L  R  L Thrombosis    o  o  o     o     o      o Spontaneous   +  +  +     +     +      + Phasic        +  +  +     +     +      + Augmentation  +  +  +     +     +      + Compressible  +  +  +     +     +      + Competent     +  +  +     0     +      +  Legend:  + - yes  o - no  p - partial  D - decreased  IMPRESSION: 1. No evidence of deep venous thrombosis identified involving the     right lower extremity. 2. Deep venous reflux present involving the right popliteal vein. 3. No evidence of superficial venous thrombosis present, normal     compressibility is visualized.   _____________________________ Janetta Hora Fields, MD  SH/MEDQ  D:  01/17/2011  T:  01/17/2011  Job:  [id number]

## 2011-01-30 ENCOUNTER — Other Ambulatory Visit: Payer: Self-pay | Admitting: Internal Medicine

## 2011-06-13 ENCOUNTER — Encounter: Payer: Self-pay | Admitting: Internal Medicine

## 2011-06-21 ENCOUNTER — Ambulatory Visit (INDEPENDENT_AMBULATORY_CARE_PROVIDER_SITE_OTHER): Payer: 59 | Admitting: Internal Medicine

## 2011-06-21 ENCOUNTER — Encounter: Payer: Self-pay | Admitting: Internal Medicine

## 2011-06-21 VITALS — BP 142/60 | HR 92 | Wt 344.0 lb

## 2011-06-21 DIAGNOSIS — J309 Allergic rhinitis, unspecified: Secondary | ICD-10-CM

## 2011-06-21 DIAGNOSIS — J302 Other seasonal allergic rhinitis: Secondary | ICD-10-CM

## 2011-06-21 DIAGNOSIS — G4710 Hypersomnia, unspecified: Secondary | ICD-10-CM

## 2011-06-21 NOTE — Patient Instructions (Signed)
We decided to continue your present pressure setting rather than deal with more leaks. That can be revisited if necessary.   An otc antihistamine like Claritin/ loratadine or Allegra/ fexofenadine can be used as needed.

## 2011-06-21 NOTE — Progress Notes (Signed)
    Patient ID: Steven Martin, male    DOB: Dec 13, 1950, 61 y.o.   MRN: [id number]  HPI 61 yo M followed for obstructive sleep apnea complicated by insomnia and hypertension. He hasn't needed ativan much at all. He gets up at 5 AM now and has little trouble falling asleep at night around 10-11PM. We discussed metabolic effects of insufficient sleep. He feels comfortable with nasal pillows mask, Advanced, 13 cwp. Downloaded today- used 91 of days > 4 hours, with AHI 7.6.   06/21/11- 29 yo M followed for obstructive sleep apnea complicated by insomnia and hypertension. Wears CPAP every night for approx 6-7 hours. Recently having increased allergies so it messes with sleep In his old dog is waking him up usually at night and sleep fragmentation is wearing him down. CPAP download shows adequate control at 13. We would try a little higher pressure but he  leaks enough with his nasal pillows mask.  ROS-see HPI Constitutional:   No-   weight loss, night sweats, fevers, chills, fatigue, lassitude. HEENT:   No-  headaches, difficulty swallowing, tooth/dental problems, sore throat,       + sneezing, itching, ear ache, nasal congestion, post nasal drip,  CV:  No-   chest pain, orthopnea, PND, swelling in lower extremities, anasarca, dizziness, palpitations Resp: No-   shortness of breath with exertion or at rest.              No-   productive cough,  No non-productive cough,  No- coughing up of blood.              No-   change in color of mucus.  No- wheezing.   Skin: No-   rash or lesions. GI:                   GU:  MS:  No-   joint pain or swelling.   Neuro-     nothing unusual Psych:  No- change in mood or affect. No depression or anxiety.  No memory loss.    OBJ- Physical Exam General- Alert, Oriented, Affect-appropriate, Distress- none acute, obese Skin- rash-none, lesions- none, excoriation- none Lymphadenopathy- none Head- atraumatic            Eyes- Gross vision intact, PERRLA,  conjunctivae and secretions clear            Ears- Hearing, canals-normal            Nose- crusting, no-Septal dev, mucus, polyps, erosion, perforation             Throat- Mallampati II , mucosa clear , drainage- none, tonsils- atrophic Neck- flexible , trachea midline, no stridor , thyroid nl, carotid no bruit Chest - symmetrical excursion , unlabored           Heart/CV- RRR , no murmur , no gallop  , no rub, nl s1 s2                           - JVD- none , edema- none, stasis changes- none, varices- none           Lung- clear to P&A, wheeze- none, cough- none , dullness-none, rub- none           Chest wall-  Abd-  Br/ Gen/ Rectal- Not done, not indicated Extrem- cyanosis- none, clubbing, none, atrophy- none, strength- nl Neuro- grossly intact to observation

## 2011-06-24 DIAGNOSIS — J302 Other seasonal allergic rhinitis: Secondary | ICD-10-CM | POA: Insufficient documentation

## 2011-06-24 NOTE — Assessment & Plan Note (Signed)
We will leave CPAP at 13 because his nasal pillows mask leak too much higher pressure. Weight loss would help.

## 2011-06-24 NOTE — Assessment & Plan Note (Signed)
Noticeable increase in symptoms associated with the spring pollen season this year. Plan-Allegra

## 2011-07-20 ENCOUNTER — Encounter: Payer: Self-pay | Admitting: Internal Medicine

## 2011-08-25 ENCOUNTER — Telehealth: Payer: Self-pay | Admitting: *Deleted

## 2011-08-25 ENCOUNTER — Ambulatory Visit (AMBULATORY_SURGERY_CENTER): Payer: 59 | Admitting: *Deleted

## 2011-08-25 ENCOUNTER — Encounter: Payer: Self-pay | Admitting: Internal Medicine

## 2011-08-25 VITALS — Ht 75.0 in | Wt 336.6 lb

## 2011-08-25 DIAGNOSIS — Z1211 Encounter for screening for malignant neoplasm of colon: Secondary | ICD-10-CM

## 2011-08-25 MED ORDER — MOVIPREP 100 G PO SOLR: ORAL | Status: DC

## 2011-08-25 NOTE — Telephone Encounter (Signed)
Pt scheduled for colonoscopy with Dr. Leone Payor on Friday 7/12 at 10:00.  Last colonoscopy about 5 years ago with Dr. Sherin Quarry.  Release of information form signed and given to Patty [place], CMA.

## 2011-08-25 NOTE — Telephone Encounter (Signed)
Faxed ROI to Dr. Lucretia Kern office at [phone]. pj

## 2011-08-25 NOTE — Progress Notes (Signed)
Pt scheduled for colonoscopy with Dr. Gessner on Friday 7/12 at 10:00.  Last colonoscopy about 5 years ago with Dr. Weissman.  Release of information form signed and given to Patty Jordan, CMA.  

## 2011-09-04 ENCOUNTER — Telehealth: Payer: Self-pay | Admitting: Internal Medicine

## 2011-09-04 NOTE — Telephone Encounter (Signed)
No answer.  Will try to reach pt later today.

## 2011-09-04 NOTE — Telephone Encounter (Signed)
Pt does not want to have colonoscopy at this time.  Colonoscopy cancelled

## 2011-09-05 ENCOUNTER — Telehealth: Payer: Self-pay

## 2011-09-05 NOTE — Telephone Encounter (Signed)
Per Dr. Victorino December with wife, Roanna Raider, that pt's colonoscopy is due next in April of 2018.  Also let her know that we faxed a copy of the past 2008 colon done by Dr. Tasia Catchings to Dr. Geoffry Paradise.  Copy to be sent to be scanned in chart.

## 2011-09-08 ENCOUNTER — Encounter: Payer: 59 | Admitting: Internal Medicine

## 2012-06-20 ENCOUNTER — Ambulatory Visit: Payer: 59 | Admitting: Internal Medicine

## 2012-08-05 ENCOUNTER — Encounter: Payer: Self-pay | Admitting: Internal Medicine

## 2012-08-05 ENCOUNTER — Ambulatory Visit (INDEPENDENT_AMBULATORY_CARE_PROVIDER_SITE_OTHER): Payer: 59 | Admitting: Internal Medicine

## 2012-08-05 VITALS — BP 110/66 | HR 86 | Ht 74.0 in | Wt 328.0 lb

## 2012-08-05 DIAGNOSIS — G4700 Insomnia, unspecified: Secondary | ICD-10-CM

## 2012-08-05 DIAGNOSIS — G4733 Obstructive sleep apnea (adult) (pediatric): Secondary | ICD-10-CM

## 2012-08-05 DIAGNOSIS — L408 Other psoriasis: Secondary | ICD-10-CM

## 2012-08-05 MED ORDER — ZALEPLON 10 MG PO CAPS: 10.0000 mg | ORAL_CAPSULE | Freq: Every day | ORAL | Status: DC

## 2012-08-05 NOTE — Progress Notes (Signed)
Patient ID: Steven Martin, male    DOB: 1950/04/29, 62 y.o.   MRN: [id number]  HPI 62 yo M followed for obstructive sleep apnea complicated by insomnia and hypertension. He hasn't needed ativan much at all. He gets up at 5 AM now and has little trouble falling asleep at night around 10-11PM. We discussed metabolic effects of insufficient sleep. He feels comfortable with nasal pillows mask, Advanced, 13 cwp. Downloaded today- used 91 of days > 4 hours, with AHI 7.6.   06/21/11- 33 yo M followed for obstructive sleep apnea complicated by insomnia and hypertension. Wears CPAP every night for approx 6-7 hours. Recently having increased allergies so it messes with sleep In his old dog is waking him up usually at night and sleep fragmentation is wearing him down. CPAP download shows adequate control at 13. We would try a little higher pressure but he  leaks enough with his nasal pillows mask.  08/05/12- 70 yo M followed for obstructive sleep apnea complicated by insomnia and hypertension FOLLOWS FOR: wears CPAP 13/ Advanced every night on avg of 6 hours; pressure working well for patient.  Still notices some insomnia but not nocturia. Bedtime 11 PM, up 5 AM. Now on Enbrel for psoriasis.   ROS-see HPI Constitutional:   No-   weight loss, night sweats, fevers, chills, fatigue, lassitude. HEENT:   No-  headaches, difficulty swallowing, tooth/dental problems, sore throat,       No- sneezing, itching, ear ache, nasal congestion, post nasal drip,  CV:  No-   chest pain, orthopnea, PND, swelling in lower extremities, anasarca, dizziness, palpitations Resp: No-   shortness of breath with exertion or at rest.              No-   productive cough,  No non-productive cough,  No- coughing up of blood.              No-   change in color of mucus.  No- wheezing.   Skin: No-   rash or lesions. GI:   GU:  MS:  No-   joint pain or swelling.   Neuro-     nothing unusual Psych:  No- change in mood or affect. No  depression or anxiety.  No memory loss.  OBJ- Physical Exam General- Alert, Oriented, Affect-appropriate, Distress- none acute, obese Skin- rash-none, lesions- none, excoriation- none Lymphadenopathy- none Head- atraumatic            Eyes- Gross vision intact, PERRLA, conjunctivae and secretions clear            Ears- Hearing, canals-normal            Nose- crusting, no-Septal dev, mucus, polyps, erosion, perforation             Throat- Mallampati II , mucosa clear , drainage- none, tonsils- atrophic Neck- flexible , trachea midline, no stridor , thyroid nl, carotid no bruit Chest - symmetrical excursion , unlabored           Heart/CV- RRR , no murmur , no gallop  , no rub, nl s1 s2                           - JVD- none , edema- none, stasis changes- none, varices- none           Lung- clear to P&A, wheeze- none, cough- none , dullness-none, rub- none  Chest wall-  Abd-  Br/ Gen/ Rectal- Not done, not indicated Extrem- cyanosis- none, clubbing, none, atrophy- none, strength- nl Neuro- grossly intact to observation

## 2012-08-05 NOTE — Patient Instructions (Addendum)
We can continue CPAP 13/ Advanced  Script to try Sonata/ zaleplon generic for sleep if needed. Take it if you expect to sleep 3-4 more hours, so it will wear off.  Please call as needed

## 2012-08-18 ENCOUNTER — Encounter: Payer: Self-pay | Admitting: Internal Medicine

## 2012-08-18 NOTE — Assessment & Plan Note (Signed)
He averages 6 hours of sleep and I explained this may not be enough. He tends to sit up watching TV so we emphasized sleep hygiene, suggesting a bedtime 10 PM. Plan-try [employer]

## 2012-08-18 NOTE — Assessment & Plan Note (Signed)
Good compliance and control as he continues CPAP

## 2013-06-20 ENCOUNTER — Encounter: Payer: Self-pay | Admitting: Internal Medicine

## 2013-06-20 ENCOUNTER — Ambulatory Visit (INDEPENDENT_AMBULATORY_CARE_PROVIDER_SITE_OTHER): Payer: 59 | Admitting: Internal Medicine

## 2013-06-20 VITALS — BP 130/68 | HR 79 | Ht 75.0 in | Wt 306.0 lb

## 2013-06-20 DIAGNOSIS — G4733 Obstructive sleep apnea (adult) (pediatric): Secondary | ICD-10-CM

## 2013-06-20 NOTE — Progress Notes (Signed)
Patient ID: Steven Martin, male    DOB: [birth year], 63 y.o.   MRN: [id number]  HPI 63 yo M followed for obstructive sleep apnea complicated by insomnia and hypertension. He hasn't needed ativan much at all. He gets up at 5 AM now and has little trouble falling asleep at night around 10-11PM. We discussed metabolic effects of insufficient sleep. He feels comfortable with nasal pillows mask, Advanced, 13 cwp. Downloaded today- used 91 of days > 4 hours, with AHI 7.6.   06/21/11- 63 yo M followed for obstructive sleep apnea complicated by insomnia and hypertension. Wears CPAP every night for approx 6-7 hours. Recently having increased allergies so it messes with sleep In his old dog is waking him up usually at night and sleep fragmentation is wearing him down. CPAP download shows adequate control at 13. We would try a little higher pressure but he  leaks enough with his nasal pillows mask.  08/05/12- 63 yo M followed for obstructive sleep apnea complicated by insomnia and hypertension FOLLOWS FOR: wears CPAP 13/ Advanced every night on avg of 6 hours; pressure working well for patient.  Still notices some insomnia but not nocturia. Bedtime 11 PM, up 5 AM. Now on Enbrel for psoriasis.  06/20/13- 89 yo M followed for obstructive sleep apnea complicated by insomnia and hypertension Wears cpap 13/ Advanced 6-7 hours/nightly.  Pt has no issues with mask or supplies.  AHI 9.3/ hr on download. Admits some drowsiness on long trips. Caffeine may help. We discussed a trial at higher pressure.  ROS-see HPI Constitutional:   No-   weight loss, night sweats, fevers, chills, fatigue, lassitude. HEENT:   No-  headaches, difficulty swallowing, tooth/dental problems, sore throat,       No- sneezing, itching, ear ache, nasal congestion, post nasal drip,  CV:  No-   chest pain, orthopnea, PND, swelling in lower extremities, anasarca, dizziness, palpitations Resp: No-   shortness of breath with exertion or at rest.               No-   productive cough,  No non-productive cough,  No- coughing up of blood.              No-   change in color of mucus.  No- wheezing.   Skin: No-   rash or lesions. GI:   GU:  MS:  No-   joint pain or swelling.   Neuro-     nothing unusual Psych:  No- change in mood or affect. No depression or anxiety.  No memory loss.  OBJ- Physical Exam General- Alert, Oriented, Affect-appropriate, Distress- none acute, obese, laconic Skin- rash-none, lesions- none, excoriation- none Lymphadenopathy- none Head- atraumatic            Eyes- Gross vision intact, PERRLA, conjunctivae and secretions clear            Ears- Hearing, canals-normal            Nose- crusting, no-Septal dev, mucus, polyps, erosion, perforation             Throat- Mallampati II , mucosa clear , drainage- none, tonsils- atrophic Neck- flexible , trachea midline, no stridor , thyroid nl, carotid no bruit Chest - symmetrical excursion , unlabored           Heart/CV- RRR , no murmur , no gallop  , no rub, nl s1 s2                           -  JVD- none , edema- none, stasis changes- none, varices- none           Lung- clear to P&A, wheeze- none, cough- none , dullness-none, rub- none           Chest wall-  Abd-  Br/ Gen/ Rectal- Not done, not indicated Extrem- cyanosis- none, clubbing, none, atrophy- none, strength- nl Neuro- grossly intact to observation

## 2013-06-20 NOTE — Patient Instructions (Signed)
Order- DME Advanced- Increase CPAP to to 15     Dx OSA  Try to be sure you are allowing yourself enough sleep.  Please call as needed

## 2013-07-18 NOTE — Assessment & Plan Note (Signed)
Weight loss encouraged. Sleep hygiene discussed. Plan-increase CPAP to 15 for trial

## 2013-09-22 ENCOUNTER — Telehealth: Payer: Self-pay | Admitting: Internal Medicine

## 2013-09-22 NOTE — Telephone Encounter (Signed)
Called and spoke with pt and he stated that he needs something to help him stay asleep at night.  He stated that he is using the cpap and does not feel that this is the problem.  He stated that he has no problem going to sleep but he is waking up about 3 am and is not able to fall back to sleep.  Pt is requesting recs from CY.  Please advise. Thanks  Last ov--4/24/205 Next ov--06/22/2014  No Known Allergies  Current Outpatient Prescriptions on File Prior to Visit  Medication Sig Dispense Refill  . betamethasone dipropionate (DIPROLENE) 0.05 % ointment Apply topically daily as needed.       Marland Kitchen. buPROPion (WELLBUTRIN XL) 150 MG 24 hr tablet TAKE THREE TABLETS BY MOUTH EVERY DAY  90 tablet  11  . etanercept (ENBREL) 50 MG/ML injection Inject 50 mg into the skin 2 (two) times a week.        Marland Kitchen. lisinopril-hydrochlorothiazide (PRINZIDE,ZESTORETIC) 20-12.5 MG per tablet Take 1 tablet by mouth daily.        Marland Kitchen. QSYMIA 7.5-46 MG CP24 daily.       . zaleplon (SONATA) 10 MG capsule Take 1 capsule (10 mg total) by mouth at bedtime. As needed  30 capsule  prn   No current facility-administered medications on file prior to visit.

## 2013-09-22 NOTE — Telephone Encounter (Signed)
Offer trial of clonazepam 0.5 mg, # 30, 1 for sleep if needed

## 2013-09-22 NOTE — Telephone Encounter (Signed)
lmomtcb x 1  To make the pt aware.

## 2013-09-23 MED ORDER — CLONAZEPAM 0.5 MG PO TABS: 0.5000 mg | ORAL_TABLET | Freq: Every evening | ORAL | Status: DC | PRN

## 2013-09-23 NOTE — Telephone Encounter (Signed)
Spoke with pt and advised of DR Young's recommendations.  Rx called to pharmacy

## 2014-05-26 ENCOUNTER — Other Ambulatory Visit: Payer: Self-pay | Admitting: Orthopedic Surgery

## 2014-05-26 DIAGNOSIS — M25511 Pain in right shoulder: Secondary | ICD-10-CM

## 2014-06-12 ENCOUNTER — Ambulatory Visit
Admission: RE | Admit: 2014-06-12 | Discharge: 2014-06-12 | Disposition: A | Discharge status: Home / Self Care | Payer: Self-pay | Source: Ambulatory Visit | Attending: Orthopedic Surgery | Admitting: Orthopedic Surgery

## 2014-06-12 DIAGNOSIS — M25511 Pain in right shoulder: Secondary | ICD-10-CM

## 2014-06-22 ENCOUNTER — Ambulatory Visit (INDEPENDENT_AMBULATORY_CARE_PROVIDER_SITE_OTHER): Payer: 59 | Admitting: Internal Medicine

## 2014-06-22 ENCOUNTER — Encounter: Payer: Self-pay | Admitting: Internal Medicine

## 2014-06-22 VITALS — BP 118/66 | HR 75 | Ht 75.0 in | Wt 318.2 lb

## 2014-06-22 DIAGNOSIS — G4700 Insomnia, unspecified: Secondary | ICD-10-CM

## 2014-06-22 DIAGNOSIS — G4733 Obstructive sleep apnea (adult) (pediatric): Secondary | ICD-10-CM

## 2014-06-22 MED ORDER — TRAZODONE HCL 150 MG PO TABS: 150.0000 mg | ORAL_TABLET | Freq: Every evening | ORAL | Status: DC | PRN

## 2014-06-22 NOTE — Patient Instructions (Signed)
Script for trazodone 150 mg at bedtime for sleep as needed.   Please check for our information- What strength Remus Lofflerambien do you have left over at home?  Order- DME [facility]- increase CPAP to 17 cwp    Dx OSA  Please call as needed

## 2014-06-22 NOTE — Progress Notes (Signed)
Patient ID: Steven Martin, male    DOB: December 02, 1950, 64 y.o.   MRN: [id number]  HPI 64 yo M followed for obstructive sleep apnea complicated by insomnia and hypertension. He hasn't needed ativan much at all. He gets up at 5 AM now and has little trouble falling asleep at night around 10-11PM. We discussed metabolic effects of insufficient sleep. He feels comfortable with nasal pillows mask, Advanced, 13 cwp. Downloaded today- used 91 of days > 4 hours, with AHI 7.6.   06/21/11- 64 yo M followed for obstructive sleep apnea complicated by insomnia and hypertension. Wears CPAP every night for approx 6-7 hours. Recently having increased allergies so it messes with sleep In his old dog is waking him up usually at night and sleep fragmentation is wearing him down. CPAP download shows adequate control at 13. We would try a little higher pressure but he  leaks enough with his nasal pillows mask.  08/05/12- 64 yo M followed for obstructive sleep apnea complicated by insomnia and hypertension FOLLOWS FOR: wears CPAP 13/ Advanced every night on avg of 6 hours; pressure working well for patient.  Still notices some insomnia but not nocturia. Bedtime 11 PM, up 5 AM. Now on Enbrel for psoriasis.  06/20/13- 64 yo M followed for obstructive sleep apnea complicated by insomnia and hypertension Wears cpap 13/ Advanced 6-7 hours/nightly.  Pt has no issues with mask or supplies.  AHI 9.3/ hr on download. Admits some drowsiness on long trips. Caffeine may help. We discussed a trial at higher pressure.  06/22/14- 64 yo M followed for obstructive sleep apnea complicated by insomnia and hypertension CPAP increased to 15/ Advanced last visit FOLLOWS FOR: Wears CPAP every night; download attached to OV notes. Sonata not helping. He has previously tried Ambien, clonazepam and we think he has tried trazodone but willing to try trazodone again.  ROS-see HPI Constitutional:   No-   weight loss, night sweats, fevers, chills,  fatigue, lassitude. HEENT:   No-  headaches, difficulty swallowing, tooth/dental problems, sore throat,       No- sneezing, itching, ear ache, nasal congestion, post nasal drip,  CV:  No-   chest pain, orthopnea, PND, swelling in lower extremities, anasarca, dizziness, palpitations Resp: No-   shortness of breath with exertion or at rest.              No-   productive cough,  No non-productive cough,  No- coughing up of blood.              No-   change in color of mucus.  No- wheezing.   Skin: No-   rash or lesions. GI:   GU:  MS:  No-   joint pain or swelling.   Neuro-     nothing unusual Psych:  No- change in mood or affect. No depression or anxiety.  No memory loss.  OBJ- Physical Exam General- Alert, Oriented, Affect-appropriate, Distress- none acute, obese, laconic Skin- rash-none, lesions- none, excoriation- none Lymphadenopathy- none Head- atraumatic            Eyes- Gross vision intact, PERRLA, conjunctivae and secretions clear            Ears- Hearing, canals-normal            Nose- crusting, no-Septal dev, mucus, polyps, erosion, perforation             Throat- Mallampati II , mucosa clear , drainage- none, tonsils- atrophic Neck- flexible , trachea midline,  no stridor , thyroid nl, carotid no bruit Chest - symmetrical excursion , unlabored           Heart/CV- RRR , no murmur , no gallop  , no rub, nl s1 s2                           - JVD- none , edema- none, stasis changes- none, varices- none           Lung- clear to P&A, wheeze- none, cough- none , dullness-none, rub- none           Chest wall-  Abd-  Br/ Gen/ Rectal- Not done, not indicated Extrem- cyanosis- none, clubbing, none, atrophy- none, strength- nl Neuro- grossly intact to observation

## 2014-06-27 NOTE — Assessment & Plan Note (Signed)
Fair compliance. AHI is between 8 and 9 on pressure 15. We would like to do better and I think the biggest problem is his insomnia.

## 2014-06-27 NOTE — Assessment & Plan Note (Signed)
We discussed good sleep habits and expectations. Plan-try trazodone 150 mg. He will also check to see what strength Ambien he had left at home.

## 2015-06-21 ENCOUNTER — Encounter: Payer: Self-pay | Admitting: Internal Medicine

## 2015-06-21 ENCOUNTER — Ambulatory Visit (INDEPENDENT_AMBULATORY_CARE_PROVIDER_SITE_OTHER): Payer: 59 | Admitting: Internal Medicine

## 2015-06-21 VITALS — BP 132/76 | HR 78 | Ht 75.0 in | Wt 311.0 lb

## 2015-06-21 DIAGNOSIS — G4733 Obstructive sleep apnea (adult) (pediatric): Secondary | ICD-10-CM

## 2015-06-21 NOTE — Progress Notes (Signed)
Patient ID: Steven Martin, male    DOB: December 30, 1950, 65 y.o.   MRN: [id number]  HPI 65 yo M followed for obstructive sleep apnea complicated by insomnia and hypertension. He hasn't needed ativan much at all. He gets up at 5 AM now and has little trouble falling asleep at night around 10-11PM. We discussed metabolic effects of insufficient sleep. He feels comfortable with nasal pillows mask, Advanced, 13 cwp. Downloaded today- used 91 of days > 4 hours, with AHI 7.6.   06/21/11- 65 yo M followed for obstructive sleep apnea complicated by insomnia and hypertension. Wears CPAP every night for approx 6-7 hours. Recently having increased allergies so it messes with sleep In his old dog is waking him up usually at night and sleep fragmentation is wearing him down. CPAP download shows adequate control at 13. We would try a little higher pressure but he  leaks enough with his nasal pillows mask.  08/05/12- 65 yo M followed for obstructive sleep apnea complicated by insomnia and hypertension FOLLOWS FOR: wears CPAP 13/ Advanced every night on avg of 6 hours; pressure working well for patient.  Still notices some insomnia but not nocturia. Bedtime 11 PM, up 5 AM. Now on Enbrel for psoriasis.  06/20/13- 65 yo M followed for obstructive sleep apnea complicated by insomnia and hypertension Wears cpap 13/ Advanced 6-7 hours/nightly.  Pt has no issues with mask or supplies.  AHI 9.3/ hr on download. Admits some drowsiness on long trips. Caffeine may help. We discussed a trial at higher pressure.  06/22/14- 65 yo M followed for obstructive sleep apnea complicated by insomnia and hypertension CPAP increased to 15/ Advanced last visit FOLLOWS FOR: Wears CPAP every night; download attached to OV notes. Sonata not helping. He has previously tried Ambien, clonazepam and we think he has tried trazodone but willing to try trazodone again.  06/21/2015-65 year old male followed for OSA complicated by insomnia,  HBP FOLLOWS FOR:DME: [facility]. No DL-pt was not able to get card to them before today. Wears CPAP every night for aobut 5-6 hours. Pressure works well for patient. Pt continues with falling asleep-pt can not take Trazodone-trying to adjust the tablet-if takes 1 tablet then does not wake up on time.   ROS-see HPI Constitutional:   No-   weight loss, night sweats, fevers, chills, fatigue, lassitude. HEENT:   No-  headaches, difficulty swallowing, tooth/dental problems, sore throat,       No- sneezing, itching, ear ache, nasal congestion, post nasal drip,  CV:  No-   chest pain, orthopnea, PND, swelling in lower extremities, anasarca, dizziness, palpitations Resp: No-   shortness of breath with exertion or at rest.              No-   productive cough,  No non-productive cough,  No- coughing up of blood.              No-   change in color of mucus.  No- wheezing.   Skin: No-   rash or lesions. GI:   GU:  MS:  No-   joint pain or swelling.   Neuro-     nothing unusual Psych:  No- change in mood or affect. No depression or anxiety.  No memory loss.  OBJ- Physical Exam General- Alert, Oriented, Affect-appropriate, Distress- none acute, obese, laconic Skin- rash-none, lesions- none, excoriation- none Lymphadenopathy- none Head- atraumatic            Eyes- Gross vision intact, PERRLA, conjunctivae and secretions  clear            Ears- Hearing, canals-normal            Nose- crusting, no-Septal dev, mucus, polyps, erosion, perforation             Throat- Mallampati II , mucosa clear , drainage- none, tonsils- atrophic Neck- flexible , trachea midline, no stridor , thyroid nl, carotid no bruit Chest - symmetrical excursion , unlabored           Heart/CV- RRR , no murmur , no gallop  , no rub, nl s1 s2                           - JVD- none , edema- none, stasis changes- none, varices- none           Lung- clear to P&A, wheeze- none, cough- none , dullness-none, rub- none           Chest wall-  Abd-   Br/ Gen/ Rectal- Not done, not indicated Extrem- cyanosis- none, clubbing, none, atrophy- none, strength- nl Neuro- grossly intact to observation

## 2015-06-21 NOTE — Patient Instructions (Signed)
Order- DME [facility]- evaluate eligibility for replacement of old CPAP machine-  15 cwp, mask of choice, humidifier, supplies, AirView     Dx OSa  We can continue with trazodone for now to help with insomnia as needed.  Please call if we can help

## 2015-07-22 ENCOUNTER — Encounter: Payer: Self-pay | Admitting: Internal Medicine

## 2016-06-19 ENCOUNTER — Encounter: Payer: Self-pay | Admitting: Internal Medicine

## 2016-06-20 ENCOUNTER — Ambulatory Visit (INDEPENDENT_AMBULATORY_CARE_PROVIDER_SITE_OTHER): Payer: 59 | Admitting: Internal Medicine

## 2016-06-20 ENCOUNTER — Encounter: Payer: Self-pay | Admitting: Internal Medicine

## 2016-06-20 DIAGNOSIS — G4733 Obstructive sleep apnea (adult) (pediatric): Secondary | ICD-10-CM | POA: Diagnosis not present

## 2016-06-20 DIAGNOSIS — F5101 Primary insomnia: Secondary | ICD-10-CM | POA: Diagnosis not present

## 2016-06-20 MED ORDER — ZALEPLON 5 MG PO CAPS: ORAL_CAPSULE | ORAL | 5 refills | Status: DC

## 2016-06-20 NOTE — Patient Instructions (Signed)
Script printed to try zaleplon/ Sonata for sleep    Take 1 or 2 at bedtime if needed  We can continue CPAP 15/ Advanced, mask of chice, humidifier, supplies, AirView   Dx OSA

## 2016-06-20 NOTE — Progress Notes (Signed)
Patient ID: Steven Martin, male    DOB: May 14, 1950, 66 y.o.   MRN: [id number]  HPI  male former smoker followed for OSA complicated by insomnia, HBP,  NPSG 06/26/94 RDI 35/hr  -------------------------------------------------------------  06/22/14- 79 yo M followed for obstructive sleep apnea complicated by insomnia and hypertension CPAP increased to 15/ Advanced last visit FOLLOWS FOR: Wears CPAP every night; download attached to OV notes. Sonata not helping. He has previously tried Ambien, clonazepam and we think he has tried trazodone but willing to try trazodone again.  06/21/2015-66 year old male followed for OSA complicated by insomnia, HBP FOLLOWS FOR:DME: [facility]. No DL-pt was not able to get card to them before today. Wears CPAP every night for aobut 5-6 hours. Pressure works well for patient. Pt continues with falling asleep-pt can not take Trazodone-trying to adjust the tablet-if takes 1 tablet then does not wake up on time.  06/20/16- 66 year old male former smoker followed for OSA complicated by insomnia, HBP, psoriasis/ Humira CPAP 15/Advanced FOLLOWS FOR: DME: [facility]. Pt wears CPAP nightly for about 3-4 hours. Pt is having a hard time sleeping and would like to discuss options. No new supplies needed at this time.  Compliance download confirms regular use and he says he continues to feel better and sleeping better using CPAP. Insomnia remains a concern. He tried trazodone once, and maybe because of sleep dept, he slept 12 hours. He is afraid of that for fear of missing work. He has difficulty falling asleep and then is apt waking again around 2 or 3 AM for a while.  ROS-see HPI Constitutional:   No-   weight loss, night sweats, fevers, chills, fatigue, lassitude. HEENT:   No-  headaches, difficulty swallowing, tooth/dental problems, sore throat,       No- sneezing, itching, ear ache, nasal congestion, post nasal drip,  CV:  No-   chest pain, orthopnea, PND, swelling in lower  extremities, anasarca, dizziness, palpitations Resp: No-   shortness of breath with exertion or at rest.              No-   productive cough,  No non-productive cough,  No- coughing up of blood.              No-   change in color of mucus.  No- wheezing.   Skin: No-   rash or lesions. GI:   GU:  MS:  No-   joint pain or swelling.   Neuro-     nothing unusual Psych:  No- change in mood or affect. No depression or anxiety.  No memory loss.  OBJ- Physical Exam General- Alert, Oriented, Affect-appropriate, Distress- none acute, + big man/overweight Skin- rash-none, lesions- none, excoriation- none Lymphadenopathy- none Head- atraumatic            Eyes- Gross vision intact, PERRLA, conjunctivae and secretions clear            Ears- Hearing, canals-normal            Nose- crusting, no-Septal dev, mucus, polyps, erosion, perforation             Throat- Mallampati II , mucosa clear , drainage- none, tonsils- atrophic Neck- flexible , trachea midline, no stridor , thyroid nl, carotid no bruit Chest - symmetrical excursion , unlabored           Heart/CV- RRR , no murmur , no gallop  , no rub, nl s1 s2                           -  JVD- none , edema- none, stasis changes- none, varices- none           Lung- clear to P&A, wheeze- none, cough- none , dullness-none, rub- none           Chest wall-  Abd-  Br/ Gen/ Rectal- Not done, not indicated Extrem- cyanosis- none, clubbing, none, atrophy- none, strength- nl Neuro- grossly intact to observation

## 2016-06-20 NOTE — Assessment & Plan Note (Signed)
Difficulty initiating and maintaining sleep. This is a long-standing problem and is not substantially corrected by use of CPAP. We reviewed sleep hygiene again and discussed options. Plan-we are going to try replacing trazodone with a short half-life medication-Sonata as discussed.

## 2016-06-20 NOTE — Assessment & Plan Note (Signed)
Adequate compliance and he reports the pressure is comfortable. He definitely feels that CPAP is of benefit and he needs to use it. We will continue current pressure.

## 2016-07-18 ENCOUNTER — Encounter: Payer: Self-pay | Admitting: Internal Medicine

## 2016-08-07 ENCOUNTER — Encounter: Payer: Self-pay | Admitting: Internal Medicine

## 2016-08-07 ENCOUNTER — Ambulatory Visit (AMBULATORY_SURGERY_CENTER): Payer: Self-pay | Admitting: *Deleted

## 2016-08-07 VITALS — Ht 73.0 in | Wt 311.8 lb

## 2016-08-07 DIAGNOSIS — Z1211 Encounter for screening for malignant neoplasm of colon: Secondary | ICD-10-CM

## 2016-08-07 NOTE — Progress Notes (Signed)
No egg or soy allergy known to patient  No issues with past sedation with any surgeries  or procedures, no intubation problems  diet pills per patient- takes Qsymia to stop 10 days before procedure  No home 02 use per patient  No blood thinners per patient  Pt denies issues with constipation  No A fib or A flutter  EMMI video declined -

## 2016-08-21 ENCOUNTER — Ambulatory Visit (AMBULATORY_SURGERY_CENTER): Payer: 59 | Admitting: Internal Medicine

## 2016-08-21 ENCOUNTER — Encounter: Payer: Self-pay | Admitting: Internal Medicine

## 2016-08-21 VITALS — BP 105/57 | HR 62 | Temp 99.1°F | Resp 10 | Ht 73.0 in | Wt 311.0 lb

## 2016-08-21 DIAGNOSIS — Z1211 Encounter for screening for malignant neoplasm of colon: Secondary | ICD-10-CM

## 2016-08-21 DIAGNOSIS — Z1212 Encounter for screening for malignant neoplasm of rectum: Secondary | ICD-10-CM

## 2016-08-21 MED ORDER — SODIUM CHLORIDE 0.9 % IV SOLN: 500.0000 mL | INTRAVENOUS | Status: AC

## 2016-08-21 NOTE — Progress Notes (Signed)
Report to PACU, RN, vss, BBS= Clear.  

## 2016-08-21 NOTE — Op Note (Signed)
[facility] Patient Name: Steven Martin Procedure Date: 08/21/2016 8:32 AM MRN: [id number] Endoscopist: Iva Boop , MD Age: 66 Referring MD:  Date of Birth: [birth year] Gender: Male Account #: [id number] Procedure:                Colonoscopy Indications:              Screening for colorectal malignant neoplasm Last                            colonoscopy 2008 Medicines:                Propofol per Anesthesia, Monitored Anesthesia Care Procedure:                Pre-Anesthesia Assessment:                           - Prior to the procedure, a History and Physical                            was performed, and patient medications and                            allergies were reviewed. The patient's tolerance of                            previous anesthesia was also reviewed. The risks                            and benefits of the procedure and the sedation                            options and risks were discussed with the patient.                            All questions were answered, and informed consent                            was obtained. Prior Anticoagulants: The patient has                            taken no previous anticoagulant or antiplatelet                            agents. ASA Grade Assessment: III - A patient with                            severe systemic disease. After reviewing the risks                            and benefits, the patient was deemed in                            satisfactory condition to undergo the procedure.  After obtaining informed consent, the colonoscope                            was passed under direct vision. Throughout the                            procedure, the patient's blood pressure, pulse, and                            oxygen saturations were monitored continuously. The                            Colonoscope was introduced through the anus and                            advanced to the the  cecum, identified by                            appendiceal orifice and ileocecal valve. The                            quality of the bowel preparation was good. The                            colonoscopy was performed without difficulty. The                            patient tolerated the procedure well. The bowel                            preparation used was Miralax. The ileocecal valve,                            appendiceal orifice, and rectum were photographed. Scope In: 8:41:10 AM Scope Out: 8:57:11 AM Scope Withdrawal Time: 0 hours 11 minutes 10 seconds  Total Procedure Duration: 0 hours 16 minutes 1 second  Findings:                 The perianal and digital rectal examinations were                            normal. Pertinent negatives include normal prostate                            (size, shape, and consistency).                           The colon (entire examined portion) appeared normal.                           No additional abnormalities were found on                            retroflexion. Complications:            No  immediate complications. Estimated blood loss:                            None. Estimated Blood Loss:     Estimated blood loss: none. Recommendation:           - Repeat colonoscopy/other appropriate test in 10                            years for screening purposes.                           - Patient has a contact number available for                            emergencies. The signs and symptoms of potential                            delayed complications were discussed with the                            patient. Return to normal activities tomorrow.                            Written discharge instructions were provided to the                            patient.                           - Resume previous diet.                           - Continue present medications. Iva Boop, MD 08/21/2016 9:01:34 AM This report has been signed  electronically.

## 2016-08-21 NOTE — Patient Instructions (Addendum)
   The colonoscopy was normal. No polyps or cancer seen!  Next routine colonoscopy or other screening test in 10 years - 2028  I appreciate the opportunity to care for you. Iva Booparl E. Gessner, MD, FACG  YOU HAD AN ENDOSCOPIC PROCEDURE TODAY AT THE [facility]:   Refer to the procedure report that was given to you for any specific questions about what was found during the examination.  If the procedure report does not answer your questions, please call your gastroenterologist to clarify.  If you requested that your care partner not be given the details of your procedure findings, then the procedure report has been included in a sealed envelope for you to review at your convenience later.  YOU SHOULD EXPECT: Some feelings of bloating in the abdomen. Passage of more gas than usual.  Walking can help get rid of the air that was put into your GI tract during the procedure and reduce the bloating. If you had a lower endoscopy (such as a colonoscopy or flexible sigmoidoscopy) you may notice spotting of blood in your stool or on the toilet paper. If you underwent a bowel prep for your procedure, you may not have a normal bowel movement for a few days.  Please Note:  You might notice some irritation and congestion in your nose or some drainage.  This is from the oxygen used during your procedure.  There is no need for concern and it should clear up in a day or so.  SYMPTOMS TO REPORT IMMEDIATELY:   Following lower endoscopy (colonoscopy or flexible sigmoidoscopy):  Excessive amounts of blood in the stool  Significant tenderness or worsening of abdominal pains  Swelling of the abdomen that is new, acute  Fever of 100F or higher  For urgent or emergent issues, a gastroenterologist can be reached at any hour by calling [phone].   DIET:  We do recommend a small meal at first, but then you may proceed to your regular diet.  Drink plenty of fluids but you should avoid alcoholic  beverages for 24 hours.  ACTIVITY:  You should plan to take it easy for the rest of today and you should NOT DRIVE or use heavy machinery until tomorrow (because of the sedation medicines used during the test).    FOLLOW UP: Our staff will call the number listed on your records the next business day following your procedure to check on you and address any questions or concerns that you may have regarding the information given to you following your procedure. If we do not reach you, we will leave a message.  However, if you are feeling well and you are not experiencing any problems, there is no need to return our call.  We will assume that you have returned to your regular daily activities without incident.  If any biopsies were taken you will be contacted by phone or by letter within the next 1-3 weeks.  Please call us at [phone] if you have not heard about the biopsies in 3 weeks.    SIGNATURES/CONFIDENTIALITY: You and/or your care partner have signed paperwork which will be entered into your electronic medical record.  These signatures attest to the fact that that the information above on your After Visit Summary has been reviewed and is understood.  Full responsibility of the confidentiality of this discharge information lies with you and/or your care-partner.

## 2016-08-22 ENCOUNTER — Telehealth: Payer: Self-pay | Admitting: *Deleted

## 2016-08-22 NOTE — Telephone Encounter (Signed)
Left message on f/u call 

## 2016-08-22 NOTE — Telephone Encounter (Signed)
  Follow up Call-  Call back number 08/21/2016  Post procedure Call Back phone  # [phone]  Permission to leave phone message Yes  Some recent data might be hidden     Patient questions:  Do you have a fever, pain , or abdominal swelling? No. Pain Score  0 *  Have you tolerated food without any problems? Yes.    Have you been able to return to your normal activities? Yes.    Do you have any questions about your discharge instructions: Diet   No. Medications  No. Follow up visit  No.  Do you have questions or concerns about your Care? No.  Actions: * If pain score is 4 or above: No action needed, pain <4.

## 2017-01-03 ENCOUNTER — Other Ambulatory Visit: Payer: Self-pay | Admitting: Internal Medicine

## 2017-01-03 NOTE — Telephone Encounter (Signed)
CY please advise if ok to refill the sonata 5 mg.  This was last refilled on 06/20/16 with 5 refills and pt was last seen on 06/20/16 with the next appt for 06/20/17.

## 2017-01-03 NOTE — Telephone Encounter (Signed)
Ok refill 6 months 

## 2017-01-04 ENCOUNTER — Telehealth: Payer: Self-pay | Admitting: Internal Medicine

## 2017-01-04 MED ORDER — TEMAZEPAM 15 MG PO CAPS: 15.0000 mg | ORAL_CAPSULE | Freq: Every evening | ORAL | 3 refills | Status: DC | PRN

## 2017-01-04 NOTE — Telephone Encounter (Signed)
Spoke with pt. He is aware of CY's recommendation. Rx has been called in. Nothing further was needed.

## 2017-01-04 NOTE — Telephone Encounter (Signed)
Suggest we dc Sonata and Rx Temazepam 15 mg, 1-2 as needed for sleep     # 30, ref x 3

## 2017-01-04 NOTE — Telephone Encounter (Signed)
Spoke with pt, he states he does not think the Kathaleen BurySonata is helping. He states he falls asleep but wakes up at 2:30-3:30 in the morning and he is unable to fall back to sleep/ CY please advise.   [facility]

## 2017-01-25 ENCOUNTER — Other Ambulatory Visit: Payer: Self-pay | Admitting: Orthopedic Surgery

## 2017-01-25 DIAGNOSIS — M25511 Pain in right shoulder: Secondary | ICD-10-CM

## 2017-02-03 ENCOUNTER — Ambulatory Visit
Admission: RE | Admit: 2017-02-03 | Discharge: 2017-02-03 | Disposition: A | Discharge status: Home / Self Care | Payer: 59 | Source: Ambulatory Visit | Attending: Orthopedic Surgery | Admitting: Orthopedic Surgery

## 2017-02-03 DIAGNOSIS — M25511 Pain in right shoulder: Secondary | ICD-10-CM

## 2017-06-20 ENCOUNTER — Encounter: Payer: Self-pay | Admitting: Internal Medicine

## 2017-06-20 ENCOUNTER — Ambulatory Visit: Payer: 59 | Admitting: Internal Medicine

## 2017-06-20 VITALS — BP 120/80 | HR 84 | Ht 73.5 in | Wt 329.4 lb

## 2017-06-20 DIAGNOSIS — G4733 Obstructive sleep apnea (adult) (pediatric): Secondary | ICD-10-CM

## 2017-06-20 DIAGNOSIS — F5101 Primary insomnia: Secondary | ICD-10-CM

## 2017-06-20 MED ORDER — ALPRAZOLAM 0.5 MG PO TABS: ORAL_TABLET | ORAL | 5 refills | Status: DC

## 2017-06-20 NOTE — Assessment & Plan Note (Signed)
We discussed good sleep habits and reasonable expectations.  He knows I cannot help "life stress" with the pill. Plan-try Xanax.

## 2017-06-20 NOTE — Assessment & Plan Note (Signed)
Appropriate now to change to a new machine with AutoPap.  I think we can get him better control as discussed. Plan-replace old machine, changing to AutoPap 10-20

## 2017-06-20 NOTE — Patient Instructions (Addendum)
Order- DME [facility]- please replace old CPAP machine, change to auto 10-20, mask of choice, humidifier, supplies, AirView  Script printed for alprazolam (Xanax) 0.5 mg-    1-2 at night for sleep as needed. Try this instead of temazepam  Please call if we can help

## 2017-06-20 NOTE — Progress Notes (Signed)
Patient ID: Steven Martin, male    DOB: [birth year], 67 y.o.   MRN: [id number]  HPI  male former smoker followed for OSA complicated by insomnia, HBP,  NPSG 06/26/94 RDI 35/hr  -------------------------------------------------------------  06/20/16- 67 year old male former smoker followed for OSA complicated by insomnia, HBP, psoriasis/ Humira CPAP 15/Advanced FOLLOWS FOR: DME: [facility]. Pt wears CPAP nightly for about 3-4 hours. Pt is having a hard time sleeping and would like to discuss options. No new supplies needed at this time.  Compliance download confirms regular use and he says he continues to feel better and sleeping better using CPAP. Insomnia remains a concern. He tried trazodone once, and maybe because of sleep dept, he slept 12 hours. He is afraid of that for fear of missing work. He has difficulty falling asleep and then is apt waking again around 2 or 3 AM for a while.  06/20/2017- 67 year old male former smoker followed for OSA complicated by insomnia, HBP, psoriasis/ Humira,  obesity CPAP 15/[facility] ----FOLLOWS FOR: pt states he is wearing cpap avg 5-6hr nightly. feels pressure & mask are okay. DME:[facility] Arrival weight today 329 pounds He is comfortable with CPAP, sleeps better with it.  Download confirms good compliance and fair control.  Machine is old and we discussed replacing it, taking opportunities to change to AutoPap 10-20. Continues difficulty with insomnia-waking 3 AM with desired wake up time 5 AM.  Discussed sleep hygiene.  Temazepam 15 mg x 2 "some help".  Previously failed Ambien, clonazepam, trazodone.  ROS-see HPI    + = positive Constitutional:   No-   weight loss, night sweats, fevers, chills, fatigue, lassitude. HEENT:   No-  headaches, difficulty swallowing, tooth/dental problems, sore throat,       No- sneezing, itching, ear ache, nasal congestion, post nasal drip,  CV:  No-   chest pain, orthopnea, PND, swelling in lower extremities, anasarca, dizziness,  palpitations Resp: No-   shortness of breath with exertion or at rest.              No-   productive cough,  No non-productive cough,  No- coughing up of blood.              No-   change in color of mucus.  No- wheezing.   Skin: No-   rash or lesions. GI:   GU:  MS:  No-   joint pain or swelling.   Neuro-     nothing unusual Psych:  No- change in mood or affect. No depression or anxiety.  No memory loss.  OBJ- Physical Exam General- Alert, Oriented, Affect-appropriate, Distress- none acute, + big man/overweight Skin- rash-none, lesions- none, excoriation- none Lymphadenopathy- none Head- atraumatic            Eyes- Gross vision intact, PERRLA, conjunctivae and secretions clear            Ears- Hearing, canals-normal            Nose- crusting, no-Septal dev, mucus, polyps, erosion, perforation             Throat- Mallampati II , mucosa clear , drainage- none, tonsils- atrophic Neck- flexible , trachea midline, no stridor , thyroid nl, carotid no bruit Chest - symmetrical excursion , unlabored           Heart/CV- RRR , no murmur , no gallop  , no rub, nl s1 s2                           -  JVD- none , edema- none, stasis changes- none, varices- none           Lung- clear to P&A, wheeze- none, cough- none , dullness-none, rub- none           Chest wall-  Abd-  Br/ Gen/ Rectal- Not done, not indicated Extrem- cyanosis- none, clubbing, none, atrophy- none, strength- nl Neuro- grossly intact to observation

## 2017-08-06 ENCOUNTER — Telehealth: Payer: Self-pay | Admitting: Internal Medicine

## 2017-08-06 NOTE — Telephone Encounter (Signed)
Called and spoke to pt.  [facility] prior to 09/27/17. CY does not have any availability.  CY please advise. Thanks  , Current Outpatient Medications on File Prior to Visit  Medication Sig Dispense Refill  . ALPRAZolam (XANAX) 0.5 MG tablet 1-2 at night for sleep as needed 60 tablet 5  . betamethasone dipropionate (DIPROLENE) 0.05 % ointment Apply topically daily as needed.     Marland Kitchen. buPROPion (WELLBUTRIN XL) 150 MG 24 hr tablet TAKE THREE TABLETS BY MOUTH EVERY DAY 90 tablet 11  . HUMIRA PEN 40 MG/0.8ML PNKT every 14 (fourteen) days.     Marland Kitchen. lisinopril-hydrochlorothiazide (PRINZIDE,ZESTORETIC) 20-12.5 MG per tablet Take 1 tablet by mouth daily.      . temazepam (RESTORIL) 15 MG capsule Take 1-2 capsules (15-30 mg total) at bedtime as needed by mouth for sleep. 30 capsule 3  . Testosterone (FORTESTA) 10 MG/ACT (2%) GEL Place onto the skin.    . zaleplon (SONATA) 5 MG capsule TAKE 1 OR 2 CAPSULES AT BEDTIME FOR SLEEP IF NEEDED 30 capsule 5   Current Facility-Administered Medications on File Prior to Visit  Medication Dose Route Frequency Provider Last Rate Last Dose  . 0.9 %  sodium chloride infusion  500 mL Intravenous Continuous Iva BoopGessner, Carl E, MD        No Known Allergies

## 2017-08-06 NOTE — Telephone Encounter (Signed)
Ok to see NP 

## 2017-08-06 NOTE — Telephone Encounter (Signed)
Called and spoke with patient, he has been scheduled with TP on 6.18.19 at 10:45

## 2017-08-14 ENCOUNTER — Ambulatory Visit (INDEPENDENT_AMBULATORY_CARE_PROVIDER_SITE_OTHER): Payer: 59 | Admitting: Adult Health

## 2017-08-14 ENCOUNTER — Encounter: Payer: Self-pay | Admitting: Adult Health

## 2017-08-14 DIAGNOSIS — G4733 Obstructive sleep apnea (adult) (pediatric): Secondary | ICD-10-CM | POA: Diagnosis not present

## 2017-08-14 NOTE — Assessment & Plan Note (Signed)
Wt loss  

## 2017-08-14 NOTE — Patient Instructions (Addendum)
Continue on CPAP at bedtime Keep up the good work Work on Assuranthealthy weight Do not drive if sleepy Follow-up in 1 year with Dr. Maple HudsonYoung and as needed

## 2017-08-14 NOTE — Assessment & Plan Note (Signed)
Well-controlled on CPAP.  Plan  Patient Instructions  Continue on CPAP at bedtime Keep up the good work Work on healthy weight Do not drive if sleepy Follow-up in 1 year with Dr. Maple HudsonYoung and as needed

## 2017-08-14 NOTE — Progress Notes (Signed)
 @Patient  ID: Steven Martin, male    DOB: September 11, 1950, 67 y.o.   MRN: [id number]  Chief Complaint  Patient presents with  . Follow-up    OSA    Referring provider: Geoffry ParadiseAronson, Richard, MD  HPI: 67 yo male followed for OSA  NPSG 06/26/94 RDI 35/hr  08/14/2017 Follow up : OSA  Pt returns for follow up for OSA . Has recently gotten new CPAP machine , says he really likes the new CPAP machine.  Says he feels rested.  Says he does not miss any nights. Has some mild insomnia , feels it is some better.  CPAP download shows excellent compliance with average usage at 6 hours.  Patient is on AutoSet 10 to 20 cm H2O.  AHI 1.9.  Positive leaks.  No Known Allergies  Immunization History  Administered Date(s) Administered  . Influenza Split 11/28/2010, 12/29/2011, 01/20/2013, 12/28/2013, 12/29/2014  . Influenza, High Dose Seasonal PF 02/05/2017  . Pneumococcal Polysaccharide-23 02/28/2004  . Td 02/28/2004    Past Medical History:  Diagnosis Date  . Allergy   . Depression   . Hypertension   . OSA (obstructive sleep apnea)    NPSG 06-26-94; RDI 35/hr  . Osteoarthritis   . Psoriasis   . Sleep apnea    wears cpap    Tobacco History: Social History   Tobacco Use  Smoking Status Former Smoker  . Last attempt to quit: 08/24/1997  . Years since quitting: 19.9  Smokeless Tobacco Never Used   Counseling given: Not Answered   Outpatient Encounter Medications as of 08/14/2017  Medication Sig  . ALPRAZolam (XANAX) 0.5 MG tablet 1-2 at night for sleep as needed  . betamethasone dipropionate (DIPROLENE) 0.05 % ointment Apply topically daily as needed.   Marland Kitchen. buPROPion (WELLBUTRIN XL) 150 MG 24 hr tablet TAKE THREE TABLETS BY MOUTH EVERY DAY  . HUMIRA PEN 40 MG/0.8ML PNKT every 14 (fourteen) days.   Marland Kitchen. lisinopril-hydrochlorothiazide (PRINZIDE,ZESTORETIC) 20-12.5 MG per tablet Take 1 tablet by mouth daily.    . Testosterone (FORTESTA) 10 MG/ACT (2%) GEL Place onto the skin.  . [DISCONTINUED]  temazepam (RESTORIL) 15 MG capsule Take 1-2 capsules (15-30 mg total) at bedtime as needed by mouth for sleep. (Patient not taking: Reported on 08/14/2017)  . [DISCONTINUED] zaleplon (SONATA) 5 MG capsule TAKE 1 OR 2 CAPSULES AT BEDTIME FOR SLEEP IF NEEDED (Patient not taking: Reported on 08/14/2017)   Facility-Administered Encounter Medications as of 08/14/2017  Medication  . 0.9 %  sodium chloride infusion     Review of Systems  Constitutional:   No  weight loss, night sweats,  Fevers, chills, fatigue, or  lassitude.  HEENT:   No headaches,  Difficulty swallowing,  Tooth/dental problems, or  Sore throat,                No sneezing, itching, ear ache, nasal congestion, post nasal drip,   CV:  No chest pain,  Orthopnea, PND, swelling in lower extremities, anasarca, dizziness, palpitations, syncope.   GI  No heartburn, indigestion, abdominal pain, nausea, vomiting, diarrhea, change in bowel habits, loss of appetite, bloody stools.   Resp: No shortness of breath with exertion or at rest.  No excess mucus, no productive cough,  No non-productive cough,  No coughing up of blood.  No change in color of mucus.  No wheezing.  No chest wall deformity  Skin: no rash or lesions.  GU: no dysuria, change in color of urine, no urgency or frequency.  No flank  pain, no hematuria   MS:  No joint pain or swelling.  No decreased range of motion.  No back pain.    Physical Exam  BP 132/66 (BP Location: Left Arm, Cuff Size: Large)   Pulse 87   Ht 6\' 2"  (1.88 m)   Wt (!) 336 lb 6.4 oz (152.6 kg)   SpO2 96%   BMI 43.19 kg/m   GEN: A/Ox3; pleasant , NAD, morbidly obese   HEENT:  [place]/AT,  EACs-clear, TMs-wnl, NOSE-clear, THROAT-clear, no lesions, no postnasal drip or exudate noted.  Class III MP airway   NECK:  Supple w/ fair ROM; no JVD; normal carotid impulses w/o bruits; no thyromegaly or nodules palpated; no lymphadenopathy.    RESP  Clear  P & A; w/o, wheezes/ rales/ or rhonchi. no accessory  muscle use, no dullness to percussion  CARD:  RRR, no m/r/g, tr  peripheral edema, pulses intact, no cyanosis or clubbing.  GI:   Soft & nt; nml bowel sounds; no organomegaly or masses detected.   Musco: Warm bil, no deformities or joint swelling noted.   Neuro: alert, no focal deficits noted.    Skin: Warm, no lesions or rashes    Lab Results:  CBC  BNP No results found for: BNP  ProBNP No results found for: PROBNP  Imaging: No results found.   Assessment & Plan:   Obstructive sleep apnea Well-controlled on CPAP.  Plan  Patient Instructions  Continue on CPAP at bedtime Keep up the good work Work on healthy weight Do not drive if sleepy Follow-up in 1 year with Dr. Maple Hudson and as needed     Morbid obesity (HCC) Wt loss      Rubye Oaks, NP 08/14/2017

## 2017-12-21 ENCOUNTER — Telehealth: Payer: Self-pay | Admitting: Internal Medicine

## 2017-12-21 MED ORDER — ALPRAZOLAM 0.5 MG PO TABS: ORAL_TABLET | ORAL | 5 refills | Status: DC

## 2017-12-21 NOTE — Telephone Encounter (Signed)
Ok to refill as before 

## 2017-12-21 NOTE — Telephone Encounter (Signed)
Dr. Maple Hudson, please advise if we can refill pt's xanax Rx. Pt last seen at office 08/14/17. Med last filled for pt 06/20/17, #60 tabs with 5 RF.  No Known Allergies   Current Outpatient Medications:  .  ALPRAZolam (XANAX) 0.5 MG tablet, 1-2 at night for sleep as needed, Disp: 60 tablet, Rfl: 5 .  betamethasone dipropionate (DIPROLENE) 0.05 % ointment, Apply topically daily as needed. , Disp: , Rfl:  .  buPROPion (WELLBUTRIN XL) 150 MG 24 hr tablet, TAKE THREE TABLETS BY MOUTH EVERY DAY, Disp: 90 tablet, Rfl: 11 .  HUMIRA PEN 40 MG/0.8ML PNKT, every 14 (fourteen) days. , Disp: , Rfl:  .  lisinopril-hydrochlorothiazide (PRINZIDE,ZESTORETIC) 20-12.5 MG per tablet, Take 1 tablet by mouth daily.  , Disp: , Rfl:  .  Testosterone (FORTESTA) 10 MG/ACT (2%) GEL, Place onto the skin., Disp: , Rfl:   Current Facility-Administered Medications:  .  0.9 %  sodium chloride infusion, 500 mL, Intravenous, Continuous, Leone Payor Maryjean Morn, MD

## 2017-12-21 NOTE — Telephone Encounter (Signed)
Called pt's pharmacy and spoke with Michele Mcalpine, pharmacist and gave him a verbal of the xanax Rx.  Called and spoke with pt letting him know this had been done. Pt expressed understanding. Nothing further needed.

## 2018-02-18 ENCOUNTER — Encounter: Payer: Self-pay | Admitting: Podiatry

## 2018-02-18 ENCOUNTER — Other Ambulatory Visit: Payer: Self-pay | Admitting: Podiatry

## 2018-02-18 ENCOUNTER — Ambulatory Visit (INDEPENDENT_AMBULATORY_CARE_PROVIDER_SITE_OTHER): Payer: 59

## 2018-02-18 ENCOUNTER — Ambulatory Visit: Payer: 59 | Admitting: Podiatry

## 2018-02-18 VITALS — BP 132/82 | HR 82

## 2018-02-18 DIAGNOSIS — M659 Synovitis and tenosynovitis, unspecified: Secondary | ICD-10-CM

## 2018-02-18 DIAGNOSIS — M19079 Primary osteoarthritis, unspecified ankle and foot: Secondary | ICD-10-CM

## 2018-02-18 DIAGNOSIS — M2142 Flat foot [pes planus] (acquired), left foot: Secondary | ICD-10-CM | POA: Diagnosis not present

## 2018-02-18 DIAGNOSIS — M722 Plantar fascial fibromatosis: Secondary | ICD-10-CM

## 2018-02-18 DIAGNOSIS — M2141 Flat foot [pes planus] (acquired), right foot: Secondary | ICD-10-CM | POA: Diagnosis not present

## 2018-02-18 DIAGNOSIS — M65972 Unspecified synovitis and tenosynovitis, left ankle and foot: Secondary | ICD-10-CM

## 2018-02-18 DIAGNOSIS — M25572 Pain in left ankle and joints of left foot: Secondary | ICD-10-CM

## 2018-02-18 MED ORDER — MELOXICAM 15 MG PO TABS: 15.0000 mg | ORAL_TABLET | Freq: Every day | ORAL | 1 refills | Status: AC

## 2018-02-18 NOTE — Patient Instructions (Signed)
For instructions on how to put on your Tri-Lock Ankle Brace, please visit [email or web address] 

## 2018-02-23 NOTE — Progress Notes (Signed)
   Subjective:  67 year old male presenting today as a new patient with a chief complaint of intermittent bilateral foot and ankle pain that began 1-2 years ago. He states the left ankle hurts laterally and is worse than the right. Standing or walking for long periods of times increases the pain. He has been taking Advil for treatment. Patient is here for further evaluation and treatment.   Past Medical History:  Diagnosis Date  . Allergy   . Depression   . Hypertension   . OSA (obstructive sleep apnea)    NPSG 06-26-94; RDI 35/hr  . Osteoarthritis   . Psoriasis   . Sleep apnea    wears cpap       Objective/Physical Exam General: The patient is alert and oriented x3 in no acute distress.  Dermatology: Skin is warm, dry and supple bilateral lower extremities. Negative for open lesions or macerations.  Vascular: Palpable pedal pulses bilaterally. No edema or erythema noted. Capillary refill within normal limits.  Neurological: Epicritic and protective threshold grossly intact bilaterally.   Musculoskeletal Exam: Range of motion within normal limits to all pedal and ankle joints bilateral. Muscle strength 5/5 in all groups bilateral.  Upon weightbearing there is a medial longitudinal arch collapse bilaterally. Remove foot valgus noted to the bilateral lower extremities with excessive pronation upon mid stance. Pain on palpation with limited range of motion noted to the first MPJ bilateral feet. Pain with palpation noted to the medial, anterior and lateral aspects of the bilateral ankle joints.   Radiographic Exam:  Degenerative changes noted with joint space narrowing first MPJ. There also appears to be extra-articular spurring noted about the joint.   Pes planus noted on radiographic exam lateral views. Decreased calcaneal inclination and metatarsal declination angle is noted. Anterior break in the cyma line noted on lateral views. Medial talar head to deviation noted on AP  radiograph.   Assessment: 1. pes planus bilateral 2. Hallux limitus/DJD bilateral 3. Ankle synovitis bilateral  4. H/o bilateral knee replacements    Plan of Care:  1. Patient was evaluated. X-Rays reviewed.  2. Injection of 0.5 mLs Celestone Soluspan injected into the bilateral ankle joints.  3. Ankle braces dispensed bilaterally.  4. Prescription for Meloxicam provided to patient. 5. Return to clinic in 4 weeks.   Works for [employer].    Felecia ShellingBrent M. Sharicka Pogorzelski, DPM [facility]  Dr. Felecia ShellingBrent M. Maegen Wigle, DPM    [address]                                        [place], [place] [address]                Office [phone]  Fax [phone]) [phone]

## 2018-03-09 ENCOUNTER — Inpatient Hospital Stay (HOSPITAL_COMMUNITY)
Admission: EM | Admit: 2018-03-09 | Discharge: 2018-03-11 | DRG: 493 | Disposition: A | Discharge status: Home Health | Payer: 59 | Attending: Orthopaedic Surgery | Admitting: Orthopaedic Surgery

## 2018-03-09 ENCOUNTER — Emergency Department (HOSPITAL_COMMUNITY): Payer: 59

## 2018-03-09 ENCOUNTER — Encounter (HOSPITAL_COMMUNITY): Payer: Self-pay | Admitting: Physician Assistant

## 2018-03-09 DIAGNOSIS — Z807 Family history of other malignant neoplasms of lymphoid, hematopoietic and related tissues: Secondary | ICD-10-CM

## 2018-03-09 DIAGNOSIS — Z791 Long term (current) use of non-steroidal anti-inflammatories (NSAID): Secondary | ICD-10-CM

## 2018-03-09 DIAGNOSIS — S82392A Other fracture of lower end of left tibia, initial encounter for closed fracture: Secondary | ICD-10-CM | POA: Diagnosis not present

## 2018-03-09 DIAGNOSIS — R7309 Other abnormal glucose: Secondary | ICD-10-CM | POA: Diagnosis present

## 2018-03-09 DIAGNOSIS — Z79899 Other long term (current) drug therapy: Secondary | ICD-10-CM

## 2018-03-09 DIAGNOSIS — W06XXXA Fall from bed, initial encounter: Secondary | ICD-10-CM | POA: Diagnosis not present

## 2018-03-09 DIAGNOSIS — S82309A Unspecified fracture of lower end of unspecified tibia, initial encounter for closed fracture: Secondary | ICD-10-CM | POA: Diagnosis present

## 2018-03-09 DIAGNOSIS — Z801 Family history of malignant neoplasm of trachea, bronchus and lung: Secondary | ICD-10-CM

## 2018-03-09 DIAGNOSIS — S82202A Unspecified fracture of shaft of left tibia, initial encounter for closed fracture: Secondary | ICD-10-CM | POA: Diagnosis present

## 2018-03-09 DIAGNOSIS — L408 Other psoriasis: Secondary | ICD-10-CM | POA: Diagnosis present

## 2018-03-09 DIAGNOSIS — F32A Depression, unspecified: Secondary | ICD-10-CM | POA: Diagnosis present

## 2018-03-09 DIAGNOSIS — Z8249 Family history of ischemic heart disease and other diseases of the circulatory system: Secondary | ICD-10-CM

## 2018-03-09 DIAGNOSIS — M79662 Pain in left lower leg: Secondary | ICD-10-CM | POA: Diagnosis present

## 2018-03-09 DIAGNOSIS — Z419 Encounter for procedure for purposes other than remedying health state, unspecified: Secondary | ICD-10-CM

## 2018-03-09 DIAGNOSIS — S82402A Unspecified fracture of shaft of left fibula, initial encounter for closed fracture: Secondary | ICD-10-CM

## 2018-03-09 DIAGNOSIS — Z800 Family history of malignant neoplasm of digestive organs: Secondary | ICD-10-CM

## 2018-03-09 DIAGNOSIS — I10 Essential (primary) hypertension: Secondary | ICD-10-CM | POA: Diagnosis present

## 2018-03-09 DIAGNOSIS — G4733 Obstructive sleep apnea (adult) (pediatric): Secondary | ICD-10-CM | POA: Diagnosis not present

## 2018-03-09 DIAGNOSIS — R9431 Abnormal electrocardiogram [ECG] [EKG]: Secondary | ICD-10-CM | POA: Diagnosis present

## 2018-03-09 DIAGNOSIS — F329 Major depressive disorder, single episode, unspecified: Secondary | ICD-10-CM | POA: Diagnosis not present

## 2018-03-09 DIAGNOSIS — L409 Psoriasis, unspecified: Secondary | ICD-10-CM | POA: Diagnosis not present

## 2018-03-09 DIAGNOSIS — Z87891 Personal history of nicotine dependence: Secondary | ICD-10-CM

## 2018-03-09 DIAGNOSIS — R739 Hyperglycemia, unspecified: Secondary | ICD-10-CM | POA: Diagnosis present

## 2018-03-09 DIAGNOSIS — Z794 Long term (current) use of insulin: Secondary | ICD-10-CM | POA: Diagnosis not present

## 2018-03-09 DIAGNOSIS — S82301A Unspecified fracture of lower end of right tibia, initial encounter for closed fracture: Secondary | ICD-10-CM

## 2018-03-09 DIAGNOSIS — S82832A Other fracture of upper and lower end of left fibula, initial encounter for closed fracture: Principal | ICD-10-CM | POA: Diagnosis present

## 2018-03-09 DIAGNOSIS — S82401A Unspecified fracture of shaft of right fibula, initial encounter for closed fracture: Secondary | ICD-10-CM | POA: Diagnosis present

## 2018-03-09 DIAGNOSIS — S82409A Unspecified fracture of shaft of unspecified fibula, initial encounter for closed fracture: Secondary | ICD-10-CM

## 2018-03-09 DIAGNOSIS — Z825 Family history of asthma and other chronic lower respiratory diseases: Secondary | ICD-10-CM | POA: Diagnosis not present

## 2018-03-09 DIAGNOSIS — Z96653 Presence of artificial knee joint, bilateral: Secondary | ICD-10-CM | POA: Diagnosis present

## 2018-03-09 DIAGNOSIS — W010XXA Fall on same level from slipping, tripping and stumbling without subsequent striking against object, initial encounter: Secondary | ICD-10-CM

## 2018-03-09 DIAGNOSIS — Z6841 Body Mass Index (BMI) 40.0 and over, adult: Secondary | ICD-10-CM

## 2018-03-09 HISTORY — DX: Unspecified fracture of lower end of right tibia, initial encounter for closed fracture: S82.301A

## 2018-03-09 HISTORY — DX: Unspecified fracture of shaft of right fibula, initial encounter for closed fracture: S82.401A

## 2018-03-09 LAB — COMPREHENSIVE METABOLIC PANEL
ALT: 27 U/L (ref 0–44)
ANION GAP: 8 (ref 5–15)
AST: 22 U/L (ref 15–41)
Albumin: 4.2 g/dL (ref 3.5–5.0)
Alkaline Phosphatase: 71 U/L (ref 38–126)
BUN: 27 mg/dL — ABNORMAL HIGH (ref 8–23)
CO2: 24 mmol/L (ref 22–32)
Calcium: 9 mg/dL (ref 8.9–10.3)
Chloride: 105 mmol/L (ref 98–111)
Creatinine, Ser: 1.47 mg/dL — ABNORMAL HIGH (ref 0.61–1.24)
GFR calc Af Amer: 57 mL/min — ABNORMAL LOW (ref >60)
GFR calc non Af Amer: 49 mL/min — ABNORMAL LOW (ref >60)
Glucose, Bld: 97 mg/dL (ref 70–99)
Potassium: 4.4 mmol/L (ref 3.5–5.1)
Sodium: 137 mmol/L (ref 135–145)
Total Bilirubin: 1.2 mg/dL (ref 0.3–1.2)
Total Protein: 7.4 g/dL (ref 6.5–8.1)

## 2018-03-09 LAB — CBC
HCT: 50.4 % (ref 39.0–52.0)
Hemoglobin: 16.2 g/dL (ref 13.0–17.0)
MCH: 30.5 pg (ref 26.0–34.0)
MCHC: 32.1 g/dL (ref 30.0–36.0)
MCV: 94.7 fL (ref 80.0–100.0)
Platelets: 173 10*3/uL (ref 150–400)
RBC: 5.32 MIL/uL (ref 4.22–5.81)
RDW: 14.4 % (ref 11.5–15.5)
WBC: 9.7 10*3/uL (ref 4.0–10.5)
nRBC: 0 % (ref 0.0–0.2)

## 2018-03-09 LAB — SURGICAL PCR SCREEN
MRSA, PCR: NEGATIVE
Staphylococcus aureus: NEGATIVE

## 2018-03-09 MED ORDER — OXYCODONE HCL 5 MG PO TABS
5.0000 mg | ORAL_TABLET | ORAL | Status: DC | PRN
  Administered 2018-03-09 – 2018-03-11 (×5): 5 mg via ORAL
  Filled 2018-03-09 (×5): qty 1

## 2018-03-09 MED ORDER — POLYETHYLENE GLYCOL 3350 17 G PO PACK
17.0000 g | PACK | Freq: Two times a day (BID) | ORAL | Status: DC
  Administered 2018-03-09: 17 g via ORAL
  Filled 2018-03-09 (×3): qty 1

## 2018-03-09 MED ORDER — ACETAMINOPHEN 325 MG PO TABS
650.0000 mg | ORAL_TABLET | Freq: Four times a day (QID) | ORAL | Status: DC | PRN
  Administered 2018-03-11: 650 mg via ORAL
  Filled 2018-03-09: qty 2

## 2018-03-09 MED ORDER — ACETAMINOPHEN 650 MG RE SUPP: 650.0000 mg | Freq: Four times a day (QID) | RECTAL | Status: DC | PRN

## 2018-03-09 MED ORDER — HYDROMORPHONE HCL 1 MG/ML IJ SOLN: 0.5000 mg | INTRAMUSCULAR | Status: DC | PRN

## 2018-03-09 MED ORDER — METHOCARBAMOL 1000 MG/10ML IJ SOLN
500.0000 mg | Freq: Four times a day (QID) | INTRAVENOUS | Status: DC | PRN
  Filled 2018-03-09 (×2): qty 5

## 2018-03-09 MED ORDER — POTASSIUM CHLORIDE IN NACL 20-0.9 MEQ/L-% IV SOLN
INTRAVENOUS | Status: DC
  Administered 2018-03-09 – 2018-03-10 (×3): via INTRAVENOUS
  Filled 2018-03-09 (×4): qty 1000

## 2018-03-09 MED ORDER — FENTANYL CITRATE (PF) 100 MCG/2ML IJ SOLN
100.0000 ug | Freq: Once | INTRAMUSCULAR | Status: AC
  Administered 2018-03-09: 100 ug via INTRAVENOUS
  Filled 2018-03-09: qty 2

## 2018-03-09 MED ORDER — DOCUSATE SODIUM 100 MG PO CAPS
100.0000 mg | ORAL_CAPSULE | Freq: Two times a day (BID) | ORAL | Status: DC
  Filled 2018-03-09 (×3): qty 1

## 2018-03-09 NOTE — ED Notes (Signed)
Bed: WA08 Expected date:  Expected time:  Means of arrival:  Comments: EMS- 23s M, fall/ankle deformity

## 2018-03-09 NOTE — Progress Notes (Signed)
Orthopedic Tech Progress Note Patient Details:  Steven Martin 1950/04/29 [id number] ASSISTED BY PA Ortho Devices Type of Ortho Device: Ace wrap, Post splint, Post (long) splint, Stirrup splint, Long leg splint Splint Material: Plaster Ortho Device/Splint Location: Lt leg Ortho Device/Splint Interventions: Application   Post Interventions Patient Tolerated: Well Instructions Provided: Adjustment of device, Care of device   Tawni Carnes [facility] 03/09/2018, 4:18 PM

## 2018-03-09 NOTE — Progress Notes (Signed)
RT set up patient home unit CPAP. NO O2 bleed in needed. Patient is able to place himself on when he is ready.

## 2018-03-09 NOTE — Anesthesia Preprocedure Evaluation (Addendum)
Anesthesia Evaluation  Patient identified by MRN, date of birth, ID band Patient awake    Reviewed: Allergy & Precautions, NPO status , Patient's Chart, lab work & pertinent test results  History of Anesthesia Complications Negative for: history of anesthetic complications  Airway Mallampati: III  TM Distance: >3 FB Neck ROM: Full    Dental no notable dental hx. (+) Dental Advisory Given   Pulmonary former smoker,    Pulmonary exam normal        Cardiovascular hypertension, Pt. on medications Normal cardiovascular exam     Neuro/Psych PSYCHIATRIC DISORDERS Depression negative neurological ROS     GI/Hepatic negative GI ROS, Neg liver ROS,   Endo/Other  Morbid obesity  Renal/GU negative Renal ROS     Musculoskeletal   Abdominal   Peds  Hematology negative hematology ROS (+)   Anesthesia Other Findings Day of surgery medications reviewed with the patient.  Reproductive/Obstetrics                           Anesthesia Physical Anesthesia Plan  ASA: III  Anesthesia Plan: General   Post-op Pain Management: GA combined w/ Regional for post-op pain   Induction:   PONV Risk Score and Plan: 2 and Ondansetron and Dexamethasone  Airway Management Planned: LMA  Additional Equipment:   Intra-op Plan:   Post-operative Plan: Extubation in OR  Informed Consent: I have reviewed the patients History and Physical, chart, labs and discussed the procedure including the risks, benefits and alternatives for the proposed anesthesia with the patient or authorized representative who has indicated his/her understanding and acceptance.   Dental advisory given  Plan Discussed with: CRNA, Anesthesiologist and Surgeon  Anesthesia Plan Comments:        Anesthesia Quick Evaluation

## 2018-03-09 NOTE — H&P (Signed)
Steven Martin is an 68 y.o. male.   Chief Complaint: left ankle distal tibia and fibula fracture HPI: 67 yowm fell this am getting out of bed.  Injured left leg getting out of bed.  Transported to [facility].  Stabilized then transferred to [facility] for operative management of left distal tib/fib fracture.  Past Medical History:  Diagnosis Date  . Allergy   . Depression   . Hypertension   . OSA (obstructive sleep apnea)    NPSG 06-26-94; RDI 35/hr  . Osteoarthritis   . Psoriasis   . Sleep apnea    wears cpap  . Traumatic closed displaced fracture of distal end of tibia with fibula, right, initial encounter 03/09/2018    Past Surgical History:  Procedure Laterality Date  . COLONOSCOPY    . NASAL TURBINATE REDUCTION  1973  . REPLACEMENT TOTAL KNEE  2009,2010   x 2  . ROTATOR CUFF REPAIR  2009   left    Family History  Problem Relation Age of Onset  . Lymphoma Father   . Emphysema Father   . Colon cancer Maternal Grandmother 7  . Arthritis Other   . Hypertension Other   . Lung cancer Other   . Stomach cancer Paternal Uncle 59  . Crohn's disease Sister   . Colon polyps Neg Hx   . Esophageal cancer Neg Hx   . Rectal cancer Neg Hx    Social History:  reports that he quit smoking about 20 years ago. He has never used smokeless tobacco. He reports current alcohol use. He reports that he does not use drugs.  Allergies: No Known Allergies  (Not in a hospital admission)   No results found for this or any previous visit (from the past 48 hour(s)). Dg Tibia/fibula Left  Result Date: 03/09/2018 CLINICAL DATA:  Ankle injury.  Leg deformity. EXAM: LEFT TIBIA AND FIBULA - 2 VIEW COMPARISON:  None. FINDINGS: Status post tricompartmental knee replacement. Spiral fracture of the distal tibial diaphysis noted with extension into the lateral aspect of the metaphysis. This is associated with a comminuted fracture of the distal fibula proximal to the level of the ankle mortise.  IMPRESSION: Spiral fracture of the distal tibia with associated comminuted fracture of the distal fibula. Electronically Signed   By: Kennith Center M.D.   On: 03/09/2018 13:58   Dg Ankle 2 Views Left  Result Date: 03/09/2018 CLINICAL DATA:  Ankle injury while getting out of bed. Leg deformity. EXAM: LEFT ANKLE - 2 VIEW COMPARISON:  02/18/2018. FINDINGS: Two-view exam shows a spiral fracture of the distal tibial diaphysis extending into the lateral aspect of the metaphysis. This is associated with a comminuted fracture of the distal fibula diaphysis, proximal to the ankle mortise. Ankle mortise is preserved. Small fragments adjacent to the medial malleolus are compatible with remote trauma. IMPRESSION: Spiral fracture of the distal tibia with associated comminuted distal fibula fracture. Electronically Signed   By: Kennith Center M.D.   On: 03/09/2018 13:54    Review of Systems  Constitutional: Negative.   HENT: Negative.   Eyes: Negative.   Respiratory: Negative.   Cardiovascular: Negative.   Gastrointestinal: Negative.   Genitourinary: Negative.   Musculoskeletal: Positive for joint pain.  Skin: Negative.   Neurological: Negative.   Endo/Heme/Allergies: Negative.   Psychiatric/Behavioral: Negative.     Blood pressure 126/85, pulse 67, resp. rate 18, height 6\' 2"  (1.88 m), weight (!) 145.2 kg, SpO2 96 %. Physical Exam  Constitutional: He appears well-developed  and well-nourished.  HENT:  Head: Normocephalic and atraumatic.  Mouth/Throat: Oropharynx is clear and moist.  Eyes: Pupils are equal, round, and reactive to light. Conjunctivae are normal.  Neck: Neck supple.  Cardiovascular: Normal rate.  Respiratory: Breath sounds normal.  GI: Bowel sounds are normal.  Genitourinary:    Genitourinary Comments: Not pertinent to current symptomatology therefore not examined.   Musculoskeletal:     Comments: Left ankle externally rotated and swollen.  2+DP DVNI  Neurological: He is alert.   Skin: Skin is warm.  Psychiatric: He has a normal mood and affect. His behavior is normal.     Assessment Principal Problem:   Traumatic closed displaced fracture of distal end of tibia with fibula, right, initial encounter Active Problems:   Depression   Essential hypertension   PSORIASIS   Obstructive sleep apnea   HYPERGLYCEMIA, BORDERLINE   ELECTROCARDIOGRAM, ABNORMAL   Morbid obesity (HCC)   Plan [facility]   Plan for OR in am by Dr Susa SimmondsAdair  Pascal LuxKirstin J Kianni Lheureux, PA-C 03/09/2018, 3:03 PM

## 2018-03-09 NOTE — Progress Notes (Signed)
Admitted to [facility] from [facility] ED with left tib/fib fracture. Patient is alert and oriented not in any distress. Oriented to unit and staff, Plan of care discussed with patient.

## 2018-03-09 NOTE — ED Provider Notes (Signed)
[facility]-EMERGENCY DEPT Provider Note   CSN: [id number] Arrival date & time: 03/09/18  1241     History   Chief Complaint Chief Complaint  Patient presents with  . Fall    HPI Steven Martin is a 68 y.o. male.  Patient presents via ems s/p fall today at home. Was getting out of bed, right foot got caught in sheets, and fell onto left lower leg awkwardly. C/o pain above left ankle, acute onset, severe, constant, dull, non radiating. Denies other pain or injury. No head injury or loc. No headache. No neck or back pain. No chest pain or sob. No other extremity injury. Denies numbness/weakness. Skin is intact.   The history is provided by the patient.  Fall  Pertinent negatives include no chest pain, no abdominal pain, no headaches and no shortness of breath.    Past Medical History:  Diagnosis Date  . Allergy   . Depression   . Hypertension   . OSA (obstructive sleep apnea)    NPSG 06-26-94; RDI 35/hr  . Osteoarthritis   . Psoriasis   . Sleep apnea    wears cpap    Patient Active Problem List   Diagnosis Date Noted  . Morbid obesity (HCC) 08/14/2017  . Seasonal allergic rhinitis 06/24/2011  . PSORIASIS 12/21/2009  . Insomnia 12/21/2009  . Obstructive sleep apnea 12/21/2008  . DEPRESSION 08/06/2008  . HYPERTENSION 08/06/2008  . OSTEOARTHRITIS 08/06/2008  . HYPERGLYCEMIA, BORDERLINE 08/06/2008  . ELECTROCARDIOGRAM, ABNORMAL 08/06/2008    Past Surgical History:  Procedure Laterality Date  . COLONOSCOPY    . NASAL TURBINATE REDUCTION  1973  . REPLACEMENT TOTAL KNEE  2009,2010   x 2  . ROTATOR CUFF REPAIR  2009   left        Home Medications    Prior to Admission medications   Medication Sig Start Date End Date Taking? Authorizing Provider  ALPRAZolam Prudy Feeler(XANAX) 0.5 MG tablet 1-2 at night for sleep as needed 12/21/17   Jetty DuhamelYoung, Clinton D, MD  betamethasone dipropionate (DIPROLENE) 0.05 % ointment Apply topically daily as needed.  08/02/12    [provider]  buPROPion (WELLBUTRIN XL) 150 MG 24 hr tablet TAKE THREE TABLETS BY MOUTH EVERY DAY 06/03/10   Etta GrandchildJones, Thomas L, MD  HUMIRA PEN 40 MG/0.8ML PNKT every 14 (fourteen) days.  05/30/16   [provider]  lisinopril-hydrochlorothiazide (PRINZIDE,ZESTORETIC) 20-12.5 MG per tablet Take 1 tablet by mouth daily.      [provider]  meloxicam (MOBIC) 15 MG tablet Take 1 tablet (15 mg total) by mouth daily. 02/18/18 03/20/18  Felecia ShellingEvans, Brent M, DPM  Testosterone (FORTESTA) 10 MG/ACT (2%) GEL Place onto the skin.    [provider]    Family History Family History  Problem Relation Age of Onset  . Lymphoma Father   . Emphysema Father   . Colon cancer Maternal Grandmother [id number]  . Arthritis Other   . Hypertension Other   . Lung cancer Other   . Stomach cancer Paternal Uncle [id number]  . Crohn's disease Sister   . Colon polyps Neg Hx   . Esophageal cancer Neg Hx   . Rectal cancer Neg Hx     Social History Social History   Tobacco Use  . Smoking status: Former Smoker    Last attempt to quit: 08/24/1997    Years since quitting: 20.5  . Smokeless tobacco: Never Used  Substance Use Topics  . Alcohol use: Yes    Comment: occasional  .  Drug use: No     Allergies   Patient has no known allergies.   Review of Systems Review of Systems  Constitutional: Negative for fever.  HENT: Negative for nosebleeds.   Eyes: Negative for redness.  Respiratory: Negative for shortness of breath.   Cardiovascular: Negative for chest pain.  Gastrointestinal: Negative for abdominal pain.  Genitourinary: Negative for flank pain.  Musculoskeletal: Negative for back pain and neck pain.  Skin: Negative for wound.  Neurological: Negative for weakness, numbness and headaches.  Hematological: Does not bruise/bleed easily.  Psychiatric/Behavioral: Negative for confusion.     Physical Exam Updated Vital Signs BP 129/74 (BP Location: Left Arm)   Pulse 72   Ht 1.88 m  (6\' 2" )   Wt (!) 145.2 kg   SpO2 96%   BMI 41.09 kg/m   Physical Exam Vitals signs and nursing note reviewed.  Constitutional:      Appearance: Normal appearance. He is well-developed.  HENT:     Head: Atraumatic.     Nose: Nose normal.     Mouth/Throat:     Mouth: Mucous membranes are moist.  Eyes:     General: No scleral icterus.    Conjunctiva/sclera: Conjunctivae normal.     Pupils: Pupils are equal, round, and reactive to light.  Neck:     Musculoskeletal: Normal range of motion and neck supple. No neck rigidity.     Trachea: No tracheal deviation.  Cardiovascular:     Rate and Rhythm: Normal rate and regular rhythm.     Pulses: Normal pulses.     Heart sounds: Normal heart sounds. No murmur. No friction rub. No gallop.   Pulmonary:     Effort: Pulmonary effort is normal. No accessory muscle usage or respiratory distress.     Breath sounds: Normal breath sounds.  Chest:     Chest wall: No tenderness.  Abdominal:     General: There is no distension.     Tenderness: There is no abdominal tenderness.  Genitourinary:    Comments: No cva tenderness. Musculoskeletal:     Comments: Mild sts and marked pain/tenderness proximal to left ankle. Skin is intact. Dp/pt palp. CTLS spine, non tender, aligned, no step off. On bil extremity exam, no other pain or focal bony tenderness noted.   Skin:    General: Skin is warm and dry.     Findings: No rash.  Neurological:     Mental Status: He is alert.     Comments: Alert, speech clear. Motor/sens grossly intact bil.   Psychiatric:        Mood and Affect: Mood normal.      ED Treatments / Results  Labs (all labs ordered are listed, but only abnormal results are displayed) Labs Reviewed - No data to display  EKG None  Radiology Dg Tibia/fibula Left  Result Date: 03/09/2018 CLINICAL DATA:  Ankle injury.  Leg deformity. EXAM: LEFT TIBIA AND FIBULA - 2 VIEW COMPARISON:  None. FINDINGS: Status post tricompartmental knee  replacement. Spiral fracture of the distal tibial diaphysis noted with extension into the lateral aspect of the metaphysis. This is associated with a comminuted fracture of the distal fibula proximal to the level of the ankle mortise. IMPRESSION: Spiral fracture of the distal tibia with associated comminuted fracture of the distal fibula. Electronically Signed   By: Kennith Center M.D.   On: 03/09/2018 13:58   Dg Ankle 2 Views Left  Result Date: 03/09/2018 CLINICAL DATA:  Ankle injury while getting out of  bed. Leg deformity. EXAM: LEFT ANKLE - 2 VIEW COMPARISON:  02/18/2018. FINDINGS: Two-view exam shows a spiral fracture of the distal tibial diaphysis extending into the lateral aspect of the metaphysis. This is associated with a comminuted fracture of the distal fibula diaphysis, proximal to the ankle mortise. Ankle mortise is preserved. Small fragments adjacent to the medial malleolus are compatible with remote trauma. IMPRESSION: Spiral fracture of the distal tibia with associated comminuted distal fibula fracture. Electronically Signed   By: Kennith Center M.D.   On: 03/09/2018 13:54    Procedures Procedures (including critical care time)  Medications Ordered in ED Medications - No data to display   Initial Impression / Assessment and Plan / ED Course  I have reviewed the triage vital signs and the nursing notes.  Pertinent labs & imaging results that were available during my care of the patient were reviewed by me and considered in my medical decision making (see chart for details).  Iv ns.   Imaging studies ordered.  Reviewed nursing notes and prior charts for additional history.   xrays reviewed - comminuted, spiral fx distal tibia and fibula.   Pt indicates Dr Thurston Hole is his orthopedist, and to call him - Ortho/DR Thurston Hole consulted - he indicates he will review films, and contact ortho to see/manage patient.   Discussed xrays w pt. Posterior splint w stirrups. Recheck post splint,  dp/pt palp. Leg/foot elevated/icepack.   Pain is controlled, pt declines additional pain med initially (EMS gave fentanyl in route).  Dr Clide Cliff APP splinted in ED, and has arranged for Dr Susa Simmonds to admit to [facility] - she requests we place admit order for them, and [facility] will transport.   dp palp. Pain controlled. No numbness.   Pt stable for transport to [facility].     Final Clinical Impressions(s) / ED Diagnoses   Final diagnoses:  None    ED Discharge Orders    None       Cathren Laine, MD 03/09/18 [phone]

## 2018-03-09 NOTE — ED Triage Notes (Signed)
Pt BIB EMS from home with obvious left ankle deformity d/t mechanical fall.  Pt reports trying to get out of bed, foot got caught in blankets and he fell.  Pt reports "hearing a pop."  Pt received 150 mcg fentanyl en route.

## 2018-03-10 ENCOUNTER — Inpatient Hospital Stay (HOSPITAL_COMMUNITY): Payer: 59

## 2018-03-10 ENCOUNTER — Encounter (HOSPITAL_COMMUNITY)
Admission: EM | Disposition: A | Discharge status: Home Health | Payer: Self-pay | Source: Home / Self Care | Attending: Orthopaedic Surgery

## 2018-03-10 ENCOUNTER — Inpatient Hospital Stay (HOSPITAL_COMMUNITY): Payer: 59 | Admitting: Anesthesiology

## 2018-03-10 ENCOUNTER — Encounter (HOSPITAL_COMMUNITY): Payer: Self-pay | Admitting: Anesthesiology

## 2018-03-10 HISTORY — PX: ORIF TIBIA FRACTURE: SHX5416

## 2018-03-10 LAB — HIV ANTIBODY (ROUTINE TESTING W REFLEX): HIV Screen 4th Generation wRfx: NONREACTIVE

## 2018-03-10 SURGERY — OPEN REDUCTION INTERNAL FIXATION (ORIF) TIBIA FRACTURE
Anesthesia: General | Laterality: Left

## 2018-03-10 MED ORDER — MIDAZOLAM HCL 2 MG/2ML IJ SOLN
INTRAMUSCULAR | Status: AC
  Filled 2018-03-10: qty 2

## 2018-03-10 MED ORDER — LISINOPRIL 20 MG PO TABS
20.0000 mg | ORAL_TABLET | Freq: Every day | ORAL | Status: DC
  Administered 2018-03-10 – 2018-03-11 (×2): 20 mg via ORAL
  Filled 2018-03-10 (×2): qty 1

## 2018-03-10 MED ORDER — BUPIVACAINE HCL (PF) 0.5 % IJ SOLN
INTRAMUSCULAR | Status: DC | PRN
  Administered 2018-03-10: 15 mL via PERINEURAL

## 2018-03-10 MED ORDER — BUPIVACAINE LIPOSOME 1.3 % IJ SUSP
INTRAMUSCULAR | Status: DC | PRN
  Administered 2018-03-10: 10 mL via PERINEURAL

## 2018-03-10 MED ORDER — PHENYLEPHRINE 40 MCG/ML (10ML) SYRINGE FOR IV PUSH (FOR BLOOD PRESSURE SUPPORT)
PREFILLED_SYRINGE | INTRAVENOUS | Status: DC | PRN
  Administered 2018-03-10 (×3): 80 ug via INTRAVENOUS

## 2018-03-10 MED ORDER — PROPOFOL 10 MG/ML IV BOLUS
INTRAVENOUS | Status: DC | PRN
  Administered 2018-03-10: 200 mg via INTRAVENOUS

## 2018-03-10 MED ORDER — BUPIVACAINE HCL (PF) 0.5 % IJ SOLN
INTRAMUSCULAR | Status: AC
  Filled 2018-03-10: qty 30

## 2018-03-10 MED ORDER — CEFAZOLIN SODIUM-DEXTROSE 2-4 GM/100ML-% IV SOLN
2.0000 g | Freq: Four times a day (QID) | INTRAVENOUS | Status: AC
  Administered 2018-03-10 – 2018-03-11 (×3): 2 g via INTRAVENOUS
  Filled 2018-03-10 (×3): qty 100

## 2018-03-10 MED ORDER — MIDAZOLAM HCL 5 MG/5ML IJ SOLN
INTRAMUSCULAR | Status: DC | PRN
  Administered 2018-03-10: 2 mg via INTRAVENOUS

## 2018-03-10 MED ORDER — CEFAZOLIN SODIUM 1 G IJ SOLR
INTRAMUSCULAR | Status: AC
  Filled 2018-03-10: qty 30

## 2018-03-10 MED ORDER — ONDANSETRON HCL 4 MG PO TABS
4.0000 mg | ORAL_TABLET | Freq: Four times a day (QID) | ORAL | Status: DC | PRN
  Filled 2018-03-10: qty 1

## 2018-03-10 MED ORDER — SODIUM CHLORIDE 0.9 % IV SOLN
INTRAVENOUS | Status: DC | PRN
  Administered 2018-03-10: 25 ug/min via INTRAVENOUS

## 2018-03-10 MED ORDER — METOCLOPRAMIDE HCL 5 MG PO TABS: 5.0000 mg | ORAL_TABLET | Freq: Three times a day (TID) | ORAL | Status: DC | PRN

## 2018-03-10 MED ORDER — HYDROCHLOROTHIAZIDE 12.5 MG PO CAPS
12.5000 mg | ORAL_CAPSULE | Freq: Every day | ORAL | Status: DC
  Administered 2018-03-10 – 2018-03-11 (×2): 12.5 mg via ORAL
  Filled 2018-03-10 (×2): qty 1

## 2018-03-10 MED ORDER — BUPIVACAINE HCL (PF) 0.5 % IJ SOLN
INTRAMUSCULAR | Status: DC | PRN
  Administered 2018-03-10: 20 mL

## 2018-03-10 MED ORDER — FENTANYL CITRATE (PF) 100 MCG/2ML IJ SOLN: 25.0000 ug | INTRAMUSCULAR | Status: DC | PRN

## 2018-03-10 MED ORDER — LISINOPRIL-HYDROCHLOROTHIAZIDE 20-12.5 MG PO TABS: 1.0000 | ORAL_TABLET | Freq: Every day | ORAL | Status: DC

## 2018-03-10 MED ORDER — DEXTROSE 5 % IV SOLN
INTRAVENOUS | Status: DC | PRN
  Administered 2018-03-10: 3 g via INTRAVENOUS

## 2018-03-10 MED ORDER — LORATADINE 10 MG PO TABS
10.0000 mg | ORAL_TABLET | Freq: Every day | ORAL | Status: DC
  Administered 2018-03-10: 10 mg via ORAL
  Filled 2018-03-10 (×2): qty 1

## 2018-03-10 MED ORDER — MELOXICAM 7.5 MG PO TABS
15.0000 mg | ORAL_TABLET | Freq: Every day | ORAL | Status: DC
  Administered 2018-03-10 – 2018-03-11 (×2): 15 mg via ORAL
  Filled 2018-03-10 (×2): qty 2

## 2018-03-10 MED ORDER — FENTANYL CITRATE (PF) 250 MCG/5ML IJ SOLN
INTRAMUSCULAR | Status: AC
  Filled 2018-03-10: qty 5

## 2018-03-10 MED ORDER — METOCLOPRAMIDE HCL 5 MG/ML IJ SOLN: 5.0000 mg | Freq: Three times a day (TID) | INTRAMUSCULAR | Status: DC | PRN

## 2018-03-10 MED ORDER — DEXAMETHASONE SODIUM PHOSPHATE 10 MG/ML IJ SOLN
INTRAMUSCULAR | Status: DC | PRN
  Administered 2018-03-10: 10 mg via INTRAVENOUS

## 2018-03-10 MED ORDER — LACTATED RINGERS IV SOLN
INTRAVENOUS | Status: DC | PRN
  Administered 2018-03-10 (×2): via INTRAVENOUS

## 2018-03-10 MED ORDER — PROPOFOL 10 MG/ML IV BOLUS
INTRAVENOUS | Status: AC
  Filled 2018-03-10: qty 40

## 2018-03-10 MED ORDER — FENTANYL CITRATE (PF) 250 MCG/5ML IJ SOLN
INTRAMUSCULAR | Status: DC | PRN
  Administered 2018-03-10 (×4): 25 ug via INTRAVENOUS
  Administered 2018-03-10: 50 ug via INTRAVENOUS
  Administered 2018-03-10: 25 ug via INTRAVENOUS
  Administered 2018-03-10: 50 ug via INTRAVENOUS
  Administered 2018-03-10: 25 ug via INTRAVENOUS

## 2018-03-10 MED ORDER — ALPRAZOLAM 0.5 MG PO TABS: 0.5000 mg | ORAL_TABLET | Freq: Every evening | ORAL | Status: DC | PRN

## 2018-03-10 MED ORDER — ONDANSETRON HCL 4 MG/2ML IJ SOLN: 4.0000 mg | Freq: Four times a day (QID) | INTRAMUSCULAR | Status: DC | PRN

## 2018-03-10 MED ORDER — TESTOSTERONE 10 MG/ACT (2%) TD GEL: 10.0000 mg | Freq: Every day | TRANSDERMAL | Status: DC

## 2018-03-10 MED ORDER — ONDANSETRON HCL 4 MG/2ML IJ SOLN
INTRAMUSCULAR | Status: DC | PRN
  Administered 2018-03-10: 4 mg via INTRAVENOUS

## 2018-03-10 MED ORDER — PROMETHAZINE HCL 25 MG/ML IJ SOLN: 6.2500 mg | INTRAMUSCULAR | Status: DC | PRN

## 2018-03-10 MED ORDER — ONDANSETRON HCL 4 MG/2ML IJ SOLN
INTRAMUSCULAR | Status: AC
  Filled 2018-03-10: qty 2

## 2018-03-10 MED ORDER — BUPROPION HCL ER (XL) 150 MG PO TB24
450.0000 mg | ORAL_TABLET | Freq: Every day | ORAL | Status: DC
  Administered 2018-03-10 – 2018-03-11 (×2): 450 mg via ORAL
  Filled 2018-03-10 (×2): qty 3

## 2018-03-10 MED ORDER — DEXAMETHASONE SODIUM PHOSPHATE 10 MG/ML IJ SOLN
INTRAMUSCULAR | Status: AC
  Filled 2018-03-10: qty 1

## 2018-03-10 MED ORDER — 0.9 % SODIUM CHLORIDE (POUR BTL) OPTIME
TOPICAL | Status: DC | PRN
  Administered 2018-03-10: 1000 mL

## 2018-03-10 SURGICAL SUPPLY — 82 items
ALCOHOL 70% 16 OZ (MISCELLANEOUS) ×1 IMPLANT
BANDAGE ESMARK 6X9 LF (GAUZE/BANDAGES/DRESSINGS) ×1 IMPLANT
BIT DRILL 2 LNG CALIBR (DRILL) ×2 IMPLANT
BIT DRILL 2.5 CANN STRL (BIT) ×2 IMPLANT
BLADE 15 SAFETY STRL DISP (BLADE) ×2 IMPLANT
BLADE SURG 15 STRL LF DISP TIS (BLADE) ×1 IMPLANT
BLADE SURG 15 STRL SS (BLADE) ×3
BNDG CMPR 9X6 STRL LF SNTH (GAUZE/BANDAGES/DRESSINGS) ×1
BNDG CMPR MED 10X6 ELC LF (GAUZE/BANDAGES/DRESSINGS) ×1
BNDG COHESIVE 4X5 TAN STRL (GAUZE/BANDAGES/DRESSINGS) ×3 IMPLANT
BNDG COHESIVE 6X5 TAN STRL LF (GAUZE/BANDAGES/DRESSINGS) ×3 IMPLANT
BNDG ELASTIC 6X10 VLCR STRL LF (GAUZE/BANDAGES/DRESSINGS) ×2 IMPLANT
BNDG ESMARK 6X9 LF (GAUZE/BANDAGES/DRESSINGS) ×3
CANISTER SUCT 3000ML PPV (MISCELLANEOUS) ×3 IMPLANT
CHLORAPREP W/TINT 26ML (MISCELLANEOUS) ×6 IMPLANT
COVER SURGICAL LIGHT HANDLE (MISCELLANEOUS) ×3 IMPLANT
COVER WAND RF STERILE (DRAPES) ×3 IMPLANT
CUFF TOURNIQUET SINGLE 34IN LL (TOURNIQUET CUFF) ×3 IMPLANT
CUFF TOURNIQUET SINGLE 44IN (TOURNIQUET CUFF) IMPLANT
DRAPE U-SHAPE 47X51 STRL (DRAPES) ×3 IMPLANT
DRSG MEPITEL 4X7.2 (GAUZE/BANDAGES/DRESSINGS) ×1 IMPLANT
DRSG PAD ABDOMINAL 8X10 ST (GAUZE/BANDAGES/DRESSINGS) ×2 IMPLANT
ELECT REM PT RETURN 9FT ADLT (ELECTROSURGICAL) ×3
ELECTRODE REM PT RTRN 9FT ADLT (ELECTROSURGICAL) ×1 IMPLANT
GAUZE SPONGE 4X4 12PLY STRL (GAUZE/BANDAGES/DRESSINGS) ×2 IMPLANT
GAUZE XEROFORM 5X9 LF (GAUZE/BANDAGES/DRESSINGS) ×2 IMPLANT
GLOVE BIOGEL M STRL SZ7.5 (GLOVE) ×3 IMPLANT
GLOVE BIOGEL PI IND STRL 8 (GLOVE) ×1 IMPLANT
GLOVE BIOGEL PI INDICATOR 8 (GLOVE) ×2
GLOVE ECLIPSE 8.0 STRL XLNG CF (GLOVE) ×1 IMPLANT
GOWN STRL REUS W/ TWL LRG LVL3 (GOWN DISPOSABLE) ×1 IMPLANT
GOWN STRL REUS W/ TWL XL LVL3 (GOWN DISPOSABLE) ×2 IMPLANT
GOWN STRL REUS W/TWL LRG LVL3 (GOWN DISPOSABLE) ×3
GOWN STRL REUS W/TWL XL LVL3 (GOWN DISPOSABLE) ×6
K-WIRE BB-TAK (WIRE) ×9
KIT BASIN OR (CUSTOM PROCEDURE TRAY) ×3 IMPLANT
KIT TURNOVER KIT B (KITS) ×3 IMPLANT
KWIRE BB-TAK (WIRE) IMPLANT
NDL 18GX1X1/2 (RX/OR ONLY) (NEEDLE) IMPLANT
NEEDLE 18GX1X1/2 (RX/OR ONLY) (NEEDLE) ×3 IMPLANT
NS IRRIG 1000ML POUR BTL (IV SOLUTION) ×3 IMPLANT
PACK ORTHO EXTREMITY (CUSTOM PROCEDURE TRAY) ×3 IMPLANT
PAD ABD 8X10 STRL (GAUZE/BANDAGES/DRESSINGS) ×2 IMPLANT
PAD ARMBOARD 7.5X6 YLW CONV (MISCELLANEOUS) ×10 IMPLANT
PAD CAST 4YDX4 CTTN HI CHSV (CAST SUPPLIES) ×1 IMPLANT
PADDING CAST COTTON 4X4 STRL (CAST SUPPLIES)
PADDING CAST COTTON 6X4 STRL (CAST SUPPLIES) ×2 IMPLANT
PLATE ANT DIST TIB SS 14H LFT (Plate) ×2 IMPLANT
PLATE LOCK 12H SS STRAIGHT (Plate) ×2 IMPLANT
PLATE LOCKING TUBULAR 4H (Plate) ×2 IMPLANT
PLATE THIRD TUBULAR 8 HOLE (Plate) ×2 IMPLANT
SCREW CANC T15 FT 16X4XST (Screw) IMPLANT
SCREW CANCELLOUS 4.0X16MM (Screw) ×3 IMPLANT
SCREW CANCELLOUS LP 4.0X18 (Screw) ×4 IMPLANT
SCREW CORT T15 FT 32X3.5XST (Screw) IMPLANT
SCREW CORTICAL 3.5 (Screw) ×9 IMPLANT
SCREW LOW PROFILE 3.5X14 (Screw) ×2 IMPLANT
SCREW LOW PROFILE 3.5X16 (Screw) ×4 IMPLANT
SCREW LOW PROFILE 3.5X28 (Screw) ×2 IMPLANT
SCREW LOW PROFILE 3.5X30 (Screw) ×2 IMPLANT
SCREW LP CORT 3.5X38 (Screw) ×2 IMPLANT
SCREW NLOCK T15 FT 18X3.5XST (Screw) IMPLANT
SCREW NON LOCK 3.5X18MM (Screw) ×3 IMPLANT
SCREW NON LOCK 3.5X36 ANKLE (Screw) ×4 IMPLANT
SCREW NON LOCKING 3.5X10 (Screw) ×8 IMPLANT
SPLINT PLASTER CAST XFAST 5X30 (CAST SUPPLIES) IMPLANT
SPLINT PLASTER XFAST SET 5X30 (CAST SUPPLIES) ×2
SPONGE LAP 18X18 X RAY DECT (DISPOSABLE) ×1 IMPLANT
STAPLER VISISTAT (STAPLE) ×4 IMPLANT
SUCTION FRAZIER HANDLE 10FR (MISCELLANEOUS) ×2
SUCTION TUBE FRAZIER 10FR DISP (MISCELLANEOUS) ×1 IMPLANT
SUT ETHILON 2 0 FS 18 (SUTURE) ×2 IMPLANT
SUT ETHILON 3 0 PS 1 (SUTURE) ×1 IMPLANT
SUT MNCRL AB 3-0 PS2 18 (SUTURE) ×4 IMPLANT
SUT PDS AB 2-0 CT1 27 (SUTURE) IMPLANT
SUT VIC AB 2-0 CT1 27 (SUTURE)
SUT VIC AB 2-0 CT1 TAPERPNT 27 (SUTURE) ×2 IMPLANT
SUT VIC AB 2-0 CT2 18 VCP726D (SUTURE) ×2 IMPLANT
TOWEL OR 17X24 6PK STRL BLUE (TOWEL DISPOSABLE) ×3 IMPLANT
TOWEL OR 17X26 10 PK STRL BLUE (TOWEL DISPOSABLE) ×3 IMPLANT
TUBE CONNECTING 12'X1/4 (SUCTIONS) ×1
TUBE CONNECTING 12X1/4 (SUCTIONS) ×2 IMPLANT

## 2018-03-10 NOTE — Anesthesia Procedure Notes (Signed)
Anesthesia Regional Block: Popliteal block   Pre-Anesthetic Checklist: ,, timeout performed, Correct Patient, Correct Site, Correct Laterality, Correct Procedure, Correct Position, site marked, Risks and benefits discussed,  Surgical consent,  Pre-op evaluation,  At surgeon's request and post-op pain management  Laterality: Left  Prep: chloraprep       Needles:  Injection technique: Single-shot  Needle Type: Echogenic Stimulator Needle          Additional Needles:   Narrative:  Start time: 03/10/2018 7:03 AM End time: 03/10/2018 7:13 AM Injection made incrementally with aspirations every 5 mL.  Performed by: Personally  Anesthesiologist: Heather Roberts, MD  Additional Notes: A functioning IV was confirmed and monitors were applied.  Sterile prep and drape, hand hygiene and sterile gloves were used.  Negative aspiration and test dose prior to incremental administration of local anesthetic. The patient tolerated the procedure well.Ultrasound  guidance: relevant anatomy identified, needle position confirmed, local anesthetic spread visualized around nerve(s), vascular puncture avoided.  Image printed for medical record.

## 2018-03-10 NOTE — Anesthesia Procedure Notes (Signed)
Procedure Name: LMA Insertion Date/Time: 03/10/2018 7:32 AM Performed by: Adria Dill, CRNA Pre-anesthesia Checklist: Patient identified, Emergency Drugs available, Suction available and Patient being monitored Patient Re-evaluated:Patient Re-evaluated prior to induction Oxygen Delivery Method: Circle system utilized Preoxygenation: Pre-oxygenation with 100% oxygen Induction Type: IV induction LMA: LMA inserted LMA Size: 5.0 Number of attempts: 1 Placement Confirmation: positive ETCO2 and breath sounds checked- equal and bilateral Tube secured with: Tape Dental Injury: Teeth and Oropharynx as per pre-operative assessment

## 2018-03-10 NOTE — Plan of Care (Signed)
  Problem: Nutrition: Goal: Adequate nutrition will be maintained Outcome: Progressing   Problem: Coping: Goal: Level of anxiety will decrease Outcome: Progressing   Problem: Elimination: Goal: Will not experience complications related to urinary retention Outcome: Progressing   Problem: Pain Managment: Goal: General experience of comfort will improve Outcome: Progressing   

## 2018-03-10 NOTE — Brief Op Note (Signed)
03/10/2018  10:50 AM  PATIENT:  Steven Martin  68 y.o. male  PRE-OPERATIVE DIAGNOSIS:  Left distal tibia/fibula fracture  POST-OPERATIVE DIAGNOSIS:  Left distal tibia/fibula fracture  PROCEDURE:  Procedure(s): OPEN REDUCTION INTERNAL FIXATION (ORIF) TIBIA/FIBULA FRACTURE (Left)  SURGEON:  Surgeon(s) and Role:    Terance Hart, MD - Primary  PHYSICIAN ASSISTANT:   ASSISTANTS: none   ANESTHESIA:   General with popliteal block  EBL:  275 mL   BLOOD ADMINISTERED:none  DRAINS: none   LOCAL MEDICATIONS USED:  MARCAINE     SPECIMEN:  No Specimen  DISPOSITION OF SPECIMEN:  N/A  COUNTS:  YES  TOURNIQUET:   Total Tourniquet Time Documented: Thigh (Left) - 121 minutes Total: Thigh (Left) - 121 minutes   DICTATION: .Reubin Milan Dictation  PLAN OF CARE: Admit to inpatient   PATIENT DISPOSITION:  PACU - hemodynamically stable.   Delay start of Pharmacological VTE agent (>24hrs) due to surgical blood loss or risk of bleeding: yes

## 2018-03-10 NOTE — Anesthesia Postprocedure Evaluation (Signed)
Anesthesia Post Note  Patient: Steven Martin  Procedure(s) Performed: OPEN REDUCTION INTERNAL FIXATION (ORIF) TIBIA/FIBULA FRACTURE (Left )     Patient location during evaluation: PACU Anesthesia Type: General Level of consciousness: sedated Pain management: pain level controlled Vital Signs Assessment: post-procedure vital signs reviewed and stable Respiratory status: spontaneous breathing and respiratory function stable Cardiovascular status: stable Postop Assessment: no apparent nausea or vomiting Anesthetic complications: no    Last Vitals:  Vitals:   03/10/18 1130 03/10/18 1143  BP: 132/83 134/80  Pulse: 70 72  Resp: 12   Temp: (!) 36.4 C 36.7 C  SpO2: 96% 98%    Last Pain:  Vitals:   03/10/18 1143  TempSrc: Oral  PainSc:                  Steven Martin,Steven Martin

## 2018-03-10 NOTE — Progress Notes (Signed)
Pt has home unit which he is placing without assistance.

## 2018-03-10 NOTE — Transfer of Care (Signed)
Immediate Anesthesia Transfer of Care Note  Patient: Steven Martin  Procedure(s) Performed: OPEN REDUCTION INTERNAL FIXATION (ORIF) TIBIA/FIBULA FRACTURE (Left )  Patient Location: PACU  Anesthesia Type:GA combined with regional for post-op pain  Level of Consciousness: awake, drowsy and patient cooperative  Airway & Oxygen Therapy: Patient Spontanous Breathing and Patient connected to nasal cannula oxygen  Post-op Assessment: Report given to RN and Post -op Vital signs reviewed and stable  Post vital signs: Reviewed and stable  Last Vitals:  Vitals Value Taken Time  BP 122/82 03/10/2018 11:02 AM  Temp 36.3 C 03/10/2018 11:02 AM  Pulse 78 03/10/2018 11:03 AM  Resp 16 03/10/2018 11:03 AM  SpO2 96 % 03/10/2018 11:03 AM  Vitals shown include unvalidated device data.  Last Pain:  Vitals:   03/10/18 0533  TempSrc:   PainSc: Asleep      Patients Stated Pain Goal: 2 (03/09/18 2135)  Complications: No apparent anesthesia complications

## 2018-03-11 LAB — VITAMIN D 25 HYDROXY (VIT D DEFICIENCY, FRACTURES): Vit D, 25-Hydroxy: 13.4 ng/mL — ABNORMAL LOW (ref 30.0–100.0)

## 2018-03-11 MED ORDER — OXYCODONE HCL 5 MG PO TABS: 5.0000 mg | ORAL_TABLET | ORAL | 0 refills | Status: AC | PRN

## 2018-03-11 MED ORDER — OXYCODONE HCL 5 MG PO TABS: 5.0000 mg | ORAL_TABLET | ORAL | 0 refills | Status: DC | PRN

## 2018-03-11 MED ORDER — ACETAMINOPHEN 325 MG PO TABS: 650.0000 mg | ORAL_TABLET | Freq: Four times a day (QID) | ORAL | 0 refills | Status: AC | PRN

## 2018-03-11 NOTE — Discharge Summary (Signed)
Physician Discharge Summary  Patient ID: Steven Martin MRN: [id number] DOB/AGE: Feb 10, 1951 68 y.o.  Admit date: 03/09/2018 Discharge date: 03/11/2018  Admission Diagnoses: Left distal tibia and distal fibula fractures.  Discharge Diagnoses:  Principal Problem:   Traumatic closed displaced fracture of distal end of tibia with fibula, right, initial encounter Active Problems:   Depression   Essential hypertension   PSORIASIS   Obstructive sleep apnea   HYPERGLYCEMIA, BORDERLINE   ELECTROCARDIOGRAM, ABNORMAL   Morbid obesity (HCC)   Tibia/fibula fracture, left, closed, initial encounter   Traumatic closed displaced fracture of distal end of tibia with fibula   Discharged Condition: good  Hospital Course: Patient was seen in the emergency department at [facility] long and diagnosed with a left distal tibia and fibula fracture after a fall.  He was transferred to [facility] for definitive surgical management.  He underwent operative fixation of his distal tibia and distal fibula on 03/10/2018.  Postoperatively he did very well.  He had a peripheral nerve block and his pain was well controlled.  He was able to mobilize well prior to PT evaluation.  Patient was deemed suitable for discharge on postoperative day 1.  Consults: None  Significant Diagnostic Studies: None  Treatments: surgery: Open treatment of left tibia fracture, open treatment of left fibula fracture  Discharge Exam: Blood pressure 100/66, pulse 74, temperature 98.3 F (36.8 C), temperature source Oral, resp. rate 16, height 6\' 2"  (1.88 m), weight (!) 145.2 kg, SpO2 97 %. General appearance: alert Head: Normocephalic, without obvious abnormality, atraumatic Eyes: negative, conjunctivae/corneas clear. PERRL, EOM's intact. Fundi benign. Ears: normal TM's and external ear canals both ears Neck: supple Resp: Normal effort Cardio: regular rate and rhythm GI: soft Extremities: Left lower extremity in splint.  Toes are numb  from peripheral nerve block.  Unable to wiggle toes due to block.  Toes are warm and well-perfused.  No swelling proximal to the splint. Skin: Skin color, texture, turgor normal. No rashes or lesions Neurologic: Alert and oriented X 3, normal strength and tone. Normal symmetric reflexes. Normal coordination and gait  Disposition: Discharge disposition: 01-Home or Self Care      Discharge instructions Nonweightbearing left lower extremity Take 325 mg aspirin for DVT prophylaxis Call the office with concerns Follow-up in 2 weeks for wound check, cast placement and x-rays.  Allergies as of 03/11/2018   No Known Allergies     Medication List    TAKE these medications   acetaminophen 325 MG tablet Commonly known as:  TYLENOL Take 2 tablets (650 mg total) by mouth every 6 (six) hours as needed for up to 30 days for mild pain (or Fever >/= 101).   ALPRAZolam 0.5 MG tablet Commonly known as:  XANAX 1-2 at night for sleep as needed What changed:    how much to take  how to take this  when to take this  reasons to take this  additional instructions   betamethasone dipropionate 0.05 % ointment Commonly known as:  DIPROLENE Apply 1 application topically daily as needed. psoriasis   buPROPion 150 MG 24 hr tablet Commonly known as:  WELLBUTRIN XL TAKE THREE TABLETS BY MOUTH EVERY DAY   carboxymethylcellulose 0.5 % Soln Commonly known as:  REFRESH PLUS Place 1 drop into both eyes 3 (three) times daily as needed (dry eyes).   cetirizine 10 MG tablet Commonly known as:  ZYRTEC Take 10 mg by mouth daily as needed for allergies.   FORTESTA 10 MG/ACT (2%) Gel Generic  drug:  Testosterone Place 10 mg onto the skin daily.   HUMIRA PEN 40 MG/0.8ML Pnkt Generic drug:  Adalimumab Inject 40 mg into the skin every 14 (fourteen) days.   lisinopril-hydrochlorothiazide 20-12.5 MG tablet Commonly known as:  PRINZIDE,ZESTORETIC Take 1 tablet by mouth daily.   meloxicam 15 MG  tablet Commonly known as:  MOBIC Take 1 tablet (15 mg total) by mouth daily.   oxyCODONE 5 MG immediate release tablet Commonly known as:  Oxy IR/ROXICODONE Take 1 tablet (5 mg total) by mouth every 4 (four) hours as needed for up to 7 days for moderate pain.        Signed: Terance HartChristopher R Taiten Brawn 03/11/2018, 9:00 AM

## 2018-03-11 NOTE — Care Management Note (Addendum)
Case Management Note  Patient Details  Name: Steven Martin MRN: [id number] Date of Birth: [birth year]  Subjective/Objective:                    Action/Plan:  Confirmed face sheet information with patient and wife at bedside. Ordered bariatric walker and shower chair with Jermain with [facility].   Provided Medicare.gov home health agency list. Patient has had [facility] in past and wants them again. Left voicemail with Dara Hoyer with [facility] awaiting a call back.    Tiffany with [facility] unable to take referral due to staffing. Patient aware wants [facility]. Referral given to [facility] with [facility] and accepted . Patient aware.   Expected Discharge Date:  03/11/18               Expected Discharge Plan:  Home w Home Health Services  In-House Referral:  NA  Discharge planning Services  CM Consult  Post Acute Care Choice:  Home Health, Durable Medical Equipment Choice offered to:  Patient, Spouse  DME Arranged:  Walker rolling, Shower stool DME Agency:  [facility]  HH Arranged:  PT HH Agency:     Status of Service:  In process, will continue to follow  If discussed at Long Length of Stay Meetings, dates discussed:    Additional Comments:  Kingsley Plan, RN 03/11/2018, 11:06 AM

## 2018-03-11 NOTE — Discharge Instructions (Signed)
DR. Nechemia [facility]iapp[facility] SURG[facility] POST-OP INSTRUCTIONS ° ° °Pain Management °1. The numbing medicine and your leg will last around 18 hours, take a dose of your pain medicine as soon as you feel it wearing off to avoid rebound pain. °2. Keep your foot elevated above heart level.  Make sure that your heel hangs free ('floats'). °3. Take all prescribed medication as directed. °4. If taking narcotic pain medication you may want to use an over-the-counter stool softener to avoid constipation. °5. You may take over-the-counter NSAIDs (ibuprofen, naproxen, etc.) as well as over-the-counter acetaminophen as directed on the packaging as a supplement for your pain and may also use it to wean away from the prescription medication. ° °Activity °? Non-weightbearing °? Postoperatively, you will be placed into a splint which stays on for 2 weeks and then will be changed at your first postop visit. ° °First Postoperative Visit °1. Your first postop visit will be at least 2 weeks after surgery.  This should be scheduled when you schedule surgery. °2. If you do not have a postoperative visit scheduled please call [phone] to schedule an appointment. °3. At the appointment your incision will be evaluated for suture removal, x-rays will be obtained if necessary. ° °General Instructions °1. Swelling is very common after foot and ankle surgery.  It often takes 3 months for the foot and ankle to begin to feel comfortable.  Some amount of swelling will persist for 6-12 months. °2. DO NOT change the dressing.  If there is a problem with the dressing (too tight, loose, gets wet, etc.) please contact Dr. Kalan Yeley's office. °3. DO NOT get the dressing wet.  For showers you can use an over-the-counter cast cover or wrap a washcloth around the top of your dressing and then cover it with a plastic bag and tape it to your leg. °4. DO NOT soak the incision (no tubs, pools, bath, etc.) until you have approval from Dr. Jonda Alanis. ° °Contact Dr. Adairs  office or go to Emergency Room if: °1. Temperature above 101° F. °2. Increasing pain that is unresponsive to pain medication or elevation °3. Excessive redness or swelling in your foot °4. Dressing problems - excessive bloody drainage, looseness or tightness, or if dressing gets wet °5. Develop pain, swelling, warmth, or discoloration of your calf ° °

## 2018-03-11 NOTE — Evaluation (Signed)
Physical Therapy Evaluation Patient Details Name: Steven Martin MRN: [id number] DOB: 11-Aug-1950 Today's Date: 03/11/2018   History of Present Illness  Admitted after fall resulting in L tibfib fx, now s/p ORIF;  has a past medical history of Allergy, Depression, Hypertension, OSA (obstructive sleep apnea), Osteoarthritis, Psoriasis, Sleep apnea, and Traumatic closed displaced fracture of distal end of tibia with fibula, right, initial encounter (03/09/2018).  Clinical Impression   Patient is s/p above surgery resulting in functional limitations due to the deficits listed below (see PT Problem List). Independent prior to admission; Presents to PT with decr functional mobility with NWB LLE status; Practiced walking with RW and with knee walker/scooter; Managing NWB LLE well with both; Stair trianing done as well; OK for dc home from PT standpoint, but will follow while he remains inpatient;  Patient will benefit from skilled PT to increase their independence and safety with mobility to allow discharge to the venue listed below.       Follow Up Recommendations Home health PT    Equipment Recommendations  Rolling walker with 5" wheels(Bari RW; shower chair; knee walker/scooter)    Recommendations for Other Services       Precautions / Restrictions Restrictions Weight Bearing Restrictions: Yes LLE Weight Bearing: Non weight bearing      Mobility  Bed Mobility Overal bed mobility: Modified Independent             General bed mobility comments: Used bed rails  Transfers Overall transfer level: Needs assistance Equipment used: Rolling walker (2 wheeled) Transfers: Sit to/from Stand Sit to Stand: Min guard;Min assist         General transfer comment: Cues for hand placement and safety; min assist to steady RW on occasion when he pull up on RW  Ambulation/Gait Ambulation/Gait assistance: Min guard Gait Distance (Feet): 60 Feet(20x3; twice with RW and once with knee  scooter) Assistive device: Rolling walker (2 wheeled)(and practiced with knee scooter) Gait Pattern/deviations: ("hop-to" pattern with rW)     General Gait Details: Overall managing well with steps with RW, and keeping NWB LLE; performed well with knee scooter also; Fatigued after short distance  Stairs Stairs: Yes Stairs assistance: Min assist Stair Management: No rails;Backwards;With walker;Step to pattern Number of Stairs: 2(x2) General stair comments: Demonstrated technqiue and pt's wife practiced steadying RW as well; Pt and wife performed stairs well backwards, and practiced using a chair without armrests that he can "sit up the step" to at home; This PT also demonstrated technqiue for stairs with crutches (if he wants to try that in the future  Wheelchair Mobility    Modified Rankin (Stroke Patients Only)       Balance                                             Pertinent Vitals/Pain Pain Assessment: No/denies pain(Nerve block still in effect)    Home Living Family/patient expects to be discharged to:: Private residence Living Arrangements: Spouse/significant other Available Help at Discharge: Available 24 hours/day Type of Home: House Home Access: Stairs to enter Entrance Stairs-Rails: None Entrance Stairs-Number of Steps: 2 Home Layout: One level Home Equipment: None      Prior Function Level of Independence: Independent               Hand Dominance        Extremity/Trunk Assessment  Upper Extremity Assessment Upper Extremity Assessment: Overall WFL for tasks assessed    Lower Extremity Assessment Lower Extremity Assessment: LLE deficits/detail LLE Deficits / Details: Ankle immobilized; decr moter and sensation L toes as nerve block was still effective; good hip and knee strength       Communication   Communication: No difficulties  Cognition Arousal/Alertness: Awake/alert Behavior During Therapy: WFL for tasks  assessed/performed Overall Cognitive Status: Within Functional Limits for tasks assessed                                        General Comments General comments (skin integrity, edema, etc.): Wife present the entire session, and actively participated    Exercises     Assessment/Plan    PT Assessment Patient needs continued PT services  PT Problem List Decreased range of motion;Decreased activity tolerance;Decreased balance;Decreased mobility;Decreased knowledge of use of DME;Decreased knowledge of precautions       PT Treatment Interventions DME instruction;Gait training;Stair training;Functional mobility training;Therapeutic activities;Therapeutic exercise;Balance training;Patient/family education    PT Goals (Current goals can be found in the Care Plan section)  Acute Rehab PT Goals Patient Stated Goal: Hopes to go home today PT Goal Formulation: With patient Time For Goal Achievement: 03/18/18 Potential to Achieve Goals: Good    Frequency Min 6X/week   Barriers to discharge        Co-evaluation               AM-PAC PT "6 Clicks" Mobility  Outcome Measure Help needed turning from your back to your side while in a flat bed without using bedrails?: None Help needed moving from lying on your back to sitting on the side of a flat bed without using bedrails?: None Help needed moving to and from a bed to a chair (including a wheelchair)?: A Little Help needed standing up from a chair using your arms (e.g., wheelchair or bedside chair)?: A Little Help needed to walk in hospital room?: A Little Help needed climbing 3-5 steps with a railing? : A Lot 6 Click Score: 19    End of Session Equipment Utilized During Treatment: Gait belt Activity Tolerance: Patient tolerated treatment well Patient left: in chair;with call bell/phone within reach Nurse Communication: Mobility status PT Visit Diagnosis: Unsteadiness on feet (R26.81);Other abnormalities of gait  and mobility (R26.89);Difficulty in walking, not elsewhere classified (R26.2)    Time: 0853-0940(end time is approximate) PT Time Calculation (min) (ACUTE ONLY): 47 min   Charges:   PT Evaluation $PT Eval Low Complexity: 1 Low PT Treatments $Gait Training: 23-37 mins        Van ClinesHolly Loucille Takach, [place]PT  Acute Rehabilitation Services Pager [phone] Office [phone]   Levi AlandHolly H Fielding Mault 03/11/2018, 10:40 AM

## 2018-03-12 ENCOUNTER — Encounter (HOSPITAL_COMMUNITY): Payer: Self-pay | Admitting: Orthopaedic Surgery

## 2018-03-18 ENCOUNTER — Ambulatory Visit: Payer: 59 | Admitting: Podiatry

## 2018-03-18 NOTE — Op Note (Signed)
Steven Martin [name] DOS: 03/10/18  PreOperative Diagnosis: Left tibia and fibula fracture  PostOperative Diagnosis: Same  PROCEDURE: Open reduction internal fixation left tibia with plate and screw construct Open reduction internal fixation left fibula with plate and screw construct  SURGEON: Nicki Guadalajarahris Ilay Capshaw, MD  ASSISTANT: None  ANESTHESIA: General with popliteal block  FINDINGS: See postoperative diagnosis  IMPLANTS: Arthrex recon plate Arthrex one third tubular plate Combination of locking and nonlocking screws  INDICATIONS:68 y.o. male fell while getting out of bed and twisted his left leg.  He had immediate pain, deformity and swelling.  He was taken to the emergency department diagnosed with a spiral tibia and fibula fractures.  These were displaced.  Given his injury he was indicated for operative treatment.  He had had a prior total knee arthroplasty and therefore was indicated for open treatment with plate and screw fixation given the inability to perform intramedullary nail fixation.  We had a lengthy discussion about the risk, benefits and alternatives of surgery which included but was not limited to wound healing complications, infection, nonunion, malunion, need for further surgery, damage surrounding structures as well as the perioperative and anesthetic risks including death.  There is also the risk of needing further surgery.  After weighing these risks he wished to proceed.  He understood that he would be nonweightbearing for a minimum of 6 weeks while this fracture was healing.  PROCEDURE: Patient was identified in the preoperative holding area.  Consent was signed myself.  Consent was signed by the patient.  Left lower extremity was marked by myself and the patient.  Preoperative nerve block was performed by anesthesia.  He was taken the operative suite and placed supine on the operative table.  General anesthesia was induced without difficulty.  Preoperative  antibiotics were given.  Bump was placed on the left hip and bone foam was used.  All bony prominences were well-padded.  A thigh tourniquet was placed on the left hip.  The left lower extremity was prepped and draped in the usual sterile fashion.  The leg was elevated and the thigh tourniquet was inflated.  We began by making an anterolateral incision to the distal leg.  This taken sharply down through skin subcutaneous tissue.  The tibialis anterior muscle belly was identified and the extensor retinacular and muscle fascia was incised sharply.  Then the tibialis anterior muscle was retracted medially.  Then inspection and blunt dissection was used to identify the neurovascular bundle underneath the extensor pollicis longus muscle belly.  These structures were then mobilized laterally.  Then the fracture fragment was identified through direct palpation and the incision was taken sharply down to bone.  Subperiosteal flaps overlying the fracture site were elevated.  Care was taken not to devitalized the distal and proximal aspect of the fracture site.  Then the fracture was mobilized through direct manipulation of the distal leg.  Using a curette and rondure the fracture was removed of interposed bony fragmentation and soft tissue.  Then the fracture was reduced through direct visualization and clamped with a Weber clamp.  The large posterior butterfly fragment was left free-floating and there was good bony apposition through the long oblique fracture.  A portion of the fracture on the medial cortex was fixed with a unicortical 4 hole one third tubular plate.  Then using a 12 hole recon plate the lateral aspect of the fracture was fixed.  Then we turned our attention to the fibula.  A direct lateral approach of the fibula  was performed at the fracture site.  This taken sharply down through skin subcutaneous tissue.  The peroneal musculature was identified and the fascia was incised sharply.  Blunt dissection was  used to identify the superficial peroneal nerve which was not visible.  The muscle was split to allow [place] to get down to the fibula.  And the fracture site was identified with palpation and sharp incision overlying the fracture site was performed to allow for subperiosteal flap creation.  The fracture was then identified and cleaned out using a rondure and curette.  There was significant amount of comminution at the fracture site.  There was some bony apposition anteriorly.  The fracture was pulled out to length and the plate was placed and held in place with a lobster claw.  The plate was fixed with nonlocking screws.  The appropriate reduction of the fibula was confirmed with AP and lateral fluoroscopy.  Final x-rays obtained showed adequate reduction and fixation.  The tourniquet was released.  Hemostasis was obtained with Bovie cautery.  The wounds were irrigated with normal saline.  The deep tissue was closed with 2-0 PDS subcuticular tissue was closed with 3-0 Monocryl 3-0 nylon was used for skin closure.  Xeroform, 4 x 4's and sterile cast padding were used.  A short leg splint was placed.  All counts were correct at the end of the case there were no complications.  POST OPERATIVE INSTRUCTIONS: Nonweightbearing left lower extremity Keep splint dry Lovenox for DVT prophylaxis while in house then transition to 325 mg aspirin upon discharge. Follow-up in 2 weeks for splint removal, suture removal, x-rays and cast placement.   BLOOD LOSS:  less than 100 mL         DRAINS: none         SPECIMEN: none       COMPLICATIONS:  * No complications entered in OR log *         Disposition: PACU - hemodynamically stable.         Condition: stable

## 2018-05-17 ENCOUNTER — Telehealth: Payer: Self-pay | Admitting: *Deleted

## 2018-05-17 MED ORDER — MELOXICAM 15 MG PO TABS: 15.0000 mg | ORAL_TABLET | Freq: Every day | ORAL | 3 refills | Status: DC

## 2018-05-17 NOTE — Telephone Encounter (Signed)
Pt requested refill of Meloxicam. Dr. Philomena Doheny refill +3.

## 2018-06-06 NOTE — Telephone Encounter (Signed)
No problem. We can discuss this at the tele-visit as scheduled.

## 2018-06-24 ENCOUNTER — Encounter: Payer: Self-pay | Admitting: Internal Medicine

## 2018-06-26 ENCOUNTER — Encounter: Payer: Self-pay | Admitting: Internal Medicine

## 2018-06-26 ENCOUNTER — Other Ambulatory Visit: Payer: Self-pay

## 2018-06-26 ENCOUNTER — Ambulatory Visit (INDEPENDENT_AMBULATORY_CARE_PROVIDER_SITE_OTHER): Payer: 59 | Admitting: Internal Medicine

## 2018-06-26 DIAGNOSIS — G4733 Obstructive sleep apnea (adult) (pediatric): Secondary | ICD-10-CM

## 2018-06-26 DIAGNOSIS — Z77090 Contact with and (suspected) exposure to asbestos: Secondary | ICD-10-CM | POA: Diagnosis not present

## 2018-06-26 DIAGNOSIS — Z87891 Personal history of nicotine dependence: Secondary | ICD-10-CM | POA: Diagnosis not present

## 2018-06-26 NOTE — Assessment & Plan Note (Signed)
He continues to benefit from CPAP, confirmed by good download results. Machine replaced 2019. Plan - continue auto 10-20 [facility]

## 2018-06-26 NOTE — Assessment & Plan Note (Signed)
We are currently constrained by Covid rules. CT is available. PFT will be done when opened up. I anticipate CT every other year to minimize asbestos exposure, unless otherwise decided. He currently doesn't have concerning symptoms.

## 2018-06-26 NOTE — Patient Instructions (Signed)
Order- Schedule PFT- dx asbestos exposure,  Former smoker  Order- schedule future CT chest, non-contrast,   - dx asbestos exposure  We can continue CPAP auto 10-20, mask of choice, humidifier, supplies, airview/ card  Please call if we can help

## 2018-06-26 NOTE — Progress Notes (Addendum)
Patient ID: Steven Martin, male    DOB: [birth year], 68 y.o.   MRN: [id number]  HPI  male former smoker followed for OSA, asbestos exposure complicated by insomnia, HBP,  NPSG 06/26/94 RDI 35/hr  (06/05/2018- Dr. Maple Hudson, I as an employee of [employer] have been exposed to Asbestos related products while working there over the past 48 years. As part of a Class Action Lawsuit against the companies who manufactured those products and also my employer who exposed me to them, I have been issued a payment instructions card by my employer to cover payment for certain tests I must have conducted now and in the future.  I am required as part of the agreement of this lawsuit decision, to have certain pulmonary tests conducted yearly. They are a Pulmonary Functions Test and a continuous non-contrast routine spiral CT of my chest.  As my pulmonary physician, I am asking if you and the [facility] practice will conduct these yearly test on my lungs. I have attached a picture copy of the payment instructions card issued to me for your consideration to use for your practice's reimbursement for these tests.  If this yearly testing is agreeable by you and [place], I would like to schedule these tests as soon as I can since I was instructed to have them done last November.  We can talk more about this during our "phone visit" scheduled for April 29th at 9:00 a.m. Thank you.  Attachments Image 1 ) -----  From: [name]  Sent: 06/26/2018 10:48 AM EDT  To: [facility]  Subject: Visit Follow-Up Question               This is the name and address of the law firm that Dr. Maple Hudson requested of me to send to him during our teleconference office visit today, April 29th.   Steven Martin[employer]    [address]    [place], [place] [address]    [phone]   They should receive the results of my yearly Pulmonary Functions Test and C-T scan that should be completed  every other year, per Dr. [name] instructions because of the radiation exposure from the test.   Please let me know if I need to provide anything else.   ------------------------------------------------------------- 06/20/2017- 68 year old male former smoker followed for OSA complicated by insomnia, HBP, psoriasis/ Humira,  obesity CPAP 15/Advanced ----FOLLOWS FOR: pt states he is wearing cpap avg 5-6hr nightly. feels pressure & mask are okay. DME:[facility] Arrival weight today 329 pounds He is comfortable with CPAP, sleeps better with it.  Download confirms good compliance and fair control.  Machine is old and we discussed replacing it, taking opportunities to change to AutoPap 10-20. Continues difficulty with insomnia-waking 3 AM with desired wake up time 5 AM.  Discussed sleep hygiene.  Temazepam 15 mg x 2 "some help".  Previously failed Ambien, clonazepam, trazodone.  ROS-see HPI    + = positive Constitutional:   No-   weight loss, night sweats, fevers, chills, fatigue, lassitude. HEENT:   No-  headaches, difficulty swallowing, tooth/dental problems, sore throat,       No- sneezing, itching, ear ache, nasal congestion, post nasal drip,  CV:  No-   chest pain, orthopnea, PND, swelling in lower extremities, anasarca, dizziness, palpitations Resp: No-   shortness of breath with exertion or at rest.              No-   productive  cough,  No non-productive cough,  No- coughing up of blood.              No-   change in color of mucus.  No- wheezing.   Skin: No-   rash or lesions. GI:   GU:  MS:  No-   joint pain or swelling.   Neuro-     nothing unusual Psych:  No- change in mood or affect. No depression or anxiety.  No memory loss.  OBJ- Physical Exam General- Alert, Oriented, Affect-appropriate, Distress- none acute, + big man/overweight Skin- rash-none, lesions- none, excoriation- none Lymphadenopathy- none Head- atraumatic            Eyes- Gross vision intact, PERRLA, conjunctivae and  secretions clear            Ears- Hearing, canals-normal            Nose- crusting, no-Septal dev, mucus, polyps, erosion, perforation             Throat- Mallampati II , mucosa clear , drainage- none, tonsils- atrophic Neck- flexible , trachea midline, no stridor , thyroid nl, carotid no bruit Chest - symmetrical excursion , unlabored           Heart/CV- RRR , no murmur , no gallop  , no rub, nl s1 s2                           - JVD- none , edema- none, stasis changes- none, varices- none           Lung- clear to P&A, wheeze- none, cough- none , dullness-none, rub- none           Chest wall-  Abd-  Br/ Gen/ Rectal- Not done, not indicated Extrem- cyanosis- none, clubbing, none, atrophy- none, strength- nl Neuro- grossly intact to observation  06/26/2018- Virtual Visit via Telephone Note  I connected with [name] on 06/26/18 at  9:00 AM EDT by telephone and verified that I am speaking with the correct person using two identifiers.   I discussed the limitations, risks, security and privacy concerns of performing an evaluation and management service by telephone and the availability of in person appointments. I also discussed with the patient that there may be a patient responsible charge related to this service. The patient expressed understanding and agreed to proceed.   History of Present Illness: 68 year old male former smoker followed for OSA, Asbestos exposure,  complicated by insomnia, HBP, psoriasis/ Humira,  obesity CPAP auto 10-20/[employer] He has also asked [place] to provide the yearly PFT and CT chest for his asbestos surveillance.  -----breathing as baseline, see MyChart email, OSA on CPAP, works well for him CPAP machine replaced last year and doing well, confirmed by good download results.  Asbestos exposure-[employer] > 40 years. Legal settlement managed through [place] [employer] [place], Atty[employer]. He had his initial evaluation at a hotel site in [place], with doctor from [facility]. He has  those records and will bring at next in-person ov. He says minimal fibrosis seen. He denies dyspnea, chest pains, blood. I discussed other non-pulmonary and esp GI cancers as rare but possible, outside of our scope at this practice.    Observations/Objective: NPSG 06/26/94 RDI 35/hr Download 97% compliance, AHI 4.1 on auto 10-20   Assessment and Plan: OSA- good compliance and control- no change Asbestos exposure- scheduling annual PFT and probably get noncontrast CT chest every other year to minimize radiation  exposure.  Follow Up Instructions: OV in 1 year. Scheduling PFT and CT as available under Covid rules.   I discussed the assessment and treatment plan with the patient. The patient was provided an opportunity to ask questions and all were answered. The patient agreed with the plan and demonstrated an understanding of the instructions.   The patient was advised to call back or seek an in-person evaluation if the symptoms worsen or if the condition fails to improve as anticipated.  I provided 24 minutes of non-face-to-face time during this encounter.   Jetty Duhamellinton Levon Boettcher, MD

## 2018-06-26 NOTE — Telephone Encounter (Signed)
Dr Maple Hudson, this message was sent to you this morning.  This is the name and address of the law firm that Dr. Maple Hudson requested of me to send to him during our teleconference office visit today, April 29th.   They should receive the results of my yearly Pulmonary Functions Test and C-T scan that should be completed every other year, per Dr. [name] instructions because of the radiation exposure from the test.   Please let me know if I need to provide anything else.            requested of me from Dr. Maple Hudson in our April 29th teleconference pertaining to the

## 2018-06-28 ENCOUNTER — Telehealth: Payer: Self-pay

## 2018-06-28 NOTE — Telephone Encounter (Signed)
Called pt to confirm appt.  No answer left messgae

## 2018-07-01 ENCOUNTER — Ambulatory Visit (INDEPENDENT_AMBULATORY_CARE_PROVIDER_SITE_OTHER)
Admission: RE | Admit: 2018-07-01 | Discharge: 2018-07-01 | Disposition: A | Discharge status: Home / Self Care | Payer: No Typology Code available for payment source | Source: Ambulatory Visit | Attending: Internal Medicine | Admitting: Internal Medicine

## 2018-07-01 ENCOUNTER — Other Ambulatory Visit: Payer: Self-pay

## 2018-07-01 DIAGNOSIS — Z77090 Contact with and (suspected) exposure to asbestos: Secondary | ICD-10-CM

## 2018-07-11 ENCOUNTER — Other Ambulatory Visit: Payer: Self-pay | Admitting: Internal Medicine

## 2018-07-11 DIAGNOSIS — J849 Interstitial pulmonary disease, unspecified: Secondary | ICD-10-CM

## 2018-07-11 DIAGNOSIS — Z77090 Contact with and (suspected) exposure to asbestos: Secondary | ICD-10-CM

## 2018-07-15 ENCOUNTER — Encounter: Payer: Self-pay | Admitting: Podiatry

## 2018-07-15 ENCOUNTER — Ambulatory Visit (INDEPENDENT_AMBULATORY_CARE_PROVIDER_SITE_OTHER): Payer: 59 | Admitting: Podiatry

## 2018-07-15 ENCOUNTER — Other Ambulatory Visit: Payer: Self-pay

## 2018-07-15 VITALS — Temp 98.4°F

## 2018-07-15 DIAGNOSIS — Z8781 Personal history of (healed) traumatic fracture: Secondary | ICD-10-CM | POA: Diagnosis not present

## 2018-07-15 DIAGNOSIS — R600 Localized edema: Secondary | ICD-10-CM | POA: Diagnosis not present

## 2018-07-17 ENCOUNTER — Telehealth: Payer: Self-pay | Admitting: Internal Medicine

## 2018-07-17 NOTE — Telephone Encounter (Signed)
Called and spoke with patient regarding questions of upcoming PFT in June 2020 Advised patient to arrive early for the appt due to in need of COVID screening prior to appt Pt expressed and verbalized understanding Nothing further needed

## 2018-07-18 ENCOUNTER — Other Ambulatory Visit: Payer: Self-pay | Admitting: Internal Medicine

## 2018-07-24 NOTE — Progress Notes (Signed)
   HPI: 68 year old male presents the office today for evaluation of left foot and ankle swelling.  Patient states that on March 09, 2018 he underwent surgical ORIF left ankle.  He says that since surgery he has had swelling with stiffness to the left foot and ankle.  He has not done anything to treat the edema to the left foot and ankle.  He presents for evaluation and treatment  Past Medical History:  Diagnosis Date  . Allergy   . Depression   . Hypertension   . OSA (obstructive sleep apnea)    NPSG 06-26-94; RDI 35/hr  . Osteoarthritis   . Psoriasis   . Sleep apnea    wears cpap  . Traumatic closed displaced fracture of distal end of tibia with fibula, right, initial encounter 03/09/2018     Physical Exam: General: The patient is alert and oriented x3 in no acute distress.  Dermatology: Skin is warm, dry and supple bilateral lower extremities. Negative for open lesions or macerations.  Vascular: Palpable pedal pulses bilaterally.  There is moderate edema noted to the left foot and ankle likely secondary to surgical ORIF. Capillary refill within normal limits.  Neurological: Epicritic and protective threshold grossly intact bilaterally.   Musculoskeletal Exam: Range of motion within normal limits to all pedal and ankle joints bilateral. Muscle strength 5/5 in all groups bilateral.   Assessment: 1.  Status post ORIF pilon fracture left ankle 2.  Postsurgical edema left foot and ankle   Plan of Care:  1. Patient evaluated. X-Rays reviewed that were taken previously at another office.  2.  I explained to the patient that he had a severe ankle fracture and swelling with ankle joint stiffness is normal postoperatively for an extended amount of time.  Continue compression to the surgical extremity 3.  Continue management my [facility] Dr. Susa Simmonds 4.  Return to clinic as needed      Felecia Shelling, DPM [facility]  Dr. Felecia Shelling, DPM    [address] [place], [place] [address]                Office [phone]  Fax [phone]

## 2018-07-26 ENCOUNTER — Other Ambulatory Visit (HOSPITAL_COMMUNITY)
Admission: RE | Admit: 2018-07-26 | Discharge: 2018-07-26 | Disposition: A | Discharge status: Home / Self Care | Payer: 59 | Source: Ambulatory Visit | Attending: Internal Medicine | Admitting: Internal Medicine

## 2018-07-26 DIAGNOSIS — Z1159 Encounter for screening for other viral diseases: Secondary | ICD-10-CM | POA: Diagnosis present

## 2018-07-27 LAB — NOVEL CORONAVIRUS, NAA (HOSP ORDER, SEND-OUT TO REF LAB; TAT 18-24 HRS): SARS-CoV-2, NAA: NOT DETECTED

## 2018-07-31 ENCOUNTER — Ambulatory Visit (INDEPENDENT_AMBULATORY_CARE_PROVIDER_SITE_OTHER): Payer: 59 | Admitting: Internal Medicine

## 2018-07-31 ENCOUNTER — Other Ambulatory Visit: Payer: Self-pay

## 2018-07-31 ENCOUNTER — Telehealth: Payer: Self-pay

## 2018-07-31 DIAGNOSIS — Z87891 Personal history of nicotine dependence: Secondary | ICD-10-CM

## 2018-07-31 DIAGNOSIS — Z77090 Contact with and (suspected) exposure to asbestos: Secondary | ICD-10-CM | POA: Diagnosis not present

## 2018-07-31 LAB — PULMONARY FUNCTION TEST
DL/VA % pred: 106 %
DL/VA: 4.29 ml/min/mmHg/L
DLCO unc % pred: 110 %
DLCO unc: 32.87 ml/min/mmHg
FEF 25-75 Post: 3.34 L/sec
FEF 25-75 Pre: 3.28 L/sec
FEF2575-%Change-Post: 1 %
FEF2575-%Pred-Post: 111 %
FEF2575-%Pred-Pre: 109 %
FEV1-%Change-Post: 0 %
FEV1-%Pred-Post: 94 %
FEV1-%Pred-Pre: 95 %
FEV1-Post: 3.69 L
FEV1-Pre: 3.73 L
FEV1FVC-%Change-Post: 3 %
FEV1FVC-%Pred-Pre: 106 %
FEV6-%Change-Post: -3 %
FEV6-%Pred-Post: 91 %
FEV6-%Pred-Pre: 94 %
FEV6-Post: 4.53 L
FEV6-Pre: 4.71 L
FEV6FVC-%Change-Post: 0 %
FEV6FVC-%Pred-Post: 105 %
FEV6FVC-%Pred-Pre: 104 %
FVC-%Change-Post: -4 %
FVC-%Pred-Post: 86 %
FVC-%Pred-Pre: 90 %
FVC-Post: 4.53 L
FVC-Pre: 4.73 L
Post FEV1/FVC ratio: 81 %
Post FEV6/FVC ratio: 100 %
Pre FEV1/FVC ratio: 79 %
Pre FEV6/FVC Ratio: 100 %
RV % pred: 102 %
RV: 2.67 L
TLC % pred: 101 %
TLC: 7.92 L

## 2018-07-31 MED ORDER — ALPRAZOLAM 0.5 MG PO TABS: ORAL_TABLET | ORAL | 5 refills | Status: DC

## 2018-07-31 NOTE — Telephone Encounter (Signed)
Patient came to office for his PFT appt. Requesting a refill of his Alprazolam made aware CY is not in clinic today. Pharmacy [facility] in [place]. Will route message to triage to address.

## 2018-07-31 NOTE — Telephone Encounter (Signed)
Pt last seen by CY 06/26/2018. Alprazolam last filled for pt 12/21/17 #60 with 5 RF. Dr. Maple Hudson, please advise if you are okay refilling pt's med. [facility] [place] Thank you!  No Known Allergies   Current Outpatient Medications:  .  ALPRAZolam (XANAX) 0.5 MG tablet, 1-2 at night for sleep as needed (Patient taking differently: Take 0.5-1 mg by mouth at bedtime as needed for sleep. ), Disp: 60 tablet, Rfl: 5 .  betamethasone dipropionate (DIPROLENE) 0.05 % ointment, Apply 1 application topically daily as needed. psoriasis, Disp: , Rfl:  .  buPROPion (WELLBUTRIN XL) 150 MG 24 hr tablet, TAKE THREE TABLETS BY MOUTH EVERY DAY (Patient taking differently: Take 450 mg by mouth daily. ), Disp: 90 tablet, Rfl: 11 .  carboxymethylcellulose (REFRESH PLUS) 0.5 % SOLN, Place 1 drop into both eyes 3 (three) times daily as needed (dry eyes)., Disp: , Rfl:  .  cetirizine (ZYRTEC) 10 MG tablet, Take 10 mg by mouth daily as needed for allergies., Disp: , Rfl:  .  HUMIRA PEN 40 MG/0.8ML PNKT, Inject 40 mg into the skin every 14 (fourteen) days. , Disp: , Rfl:  .  lisinopril-hydrochlorothiazide (PRINZIDE,ZESTORETIC) 20-12.5 MG per tablet, Take 1 tablet by mouth daily.  , Disp: , Rfl:  .  meloxicam (MOBIC) 15 MG tablet, Take 1 tablet (15 mg total) by mouth daily. Pt needs an appt prior to future refills., Disp: 30 tablet, Rfl: 3 .  Testosterone (FORTESTA) 10 MG/ACT (2%) GEL, Place 10 mg onto the skin daily. , Disp: , Rfl:   Current Facility-Administered Medications:  .  0.9 %  sodium chloride infusion, 500 mL, Intravenous, Continuous, Leone Payor Maryjean Morn, MD

## 2018-07-31 NOTE — Telephone Encounter (Signed)
Spoke with patient. He is aware that CY called in the medication for him.   Nothing further needed at time of call.

## 2018-07-31 NOTE — Progress Notes (Signed)
PFT done today. 

## 2018-07-31 NOTE — Telephone Encounter (Signed)
Alprazolam refill e-sent 

## 2018-08-07 DIAGNOSIS — J849 Interstitial pulmonary disease, unspecified: Secondary | ICD-10-CM

## 2018-08-07 NOTE — Telephone Encounter (Signed)
mychart message received from pt which I have posted below:  ----- Message -----  From: Steven Martin  Sent: 08/07/2018 10:25 AM EDT  To: Baird Lyons, MD Subject: Visit Follow-Up Question  I have a question about PULMONARY FUNCTION TEST resulted on 07/31/18, 2:05 PM.  Doctor Annamaria Boots, I am going to need you to explain the results of this test to me as I have no idea what the numbers mean. And if they are good or bad, as we'll as the results of the C-T Scan I had on May 4th.    Dr. Annamaria Boots, please advise on this for pt. Thanks!

## 2018-08-09 NOTE — Telephone Encounter (Signed)
The Pulmonary Function Test was completely normal, which is very good news.  The CT scan showed very minimal fibrotic scarring. This is non-specific. It might be from a number of causes, including possibly asbestos. There is also a very small (8 mm) nodule which may be benign.  I suggest we follow the radiologist's suggestion based on standard guidelines, and order a repeat CT scan of the chest, high resolution, no contrast, ILD protocol  to be done in 6 months.  For dx asbestos exposure, and lung nodule 49mm.

## 2018-08-13 NOTE — Telephone Encounter (Signed)
Sent pt MyChart message with CY's response. I have the order for repeat CT chest scan pended should pt agree to these measures.   Will keep encounter open to f/u when pt replies.

## 2018-08-19 NOTE — Telephone Encounter (Signed)
CT chest has already been ordered for this patient 07/11/2018 and scheduled around May 2021.   Sent pt MyChart message as an Pharmacist, hospital. Nothing further needed at this time.

## 2018-09-30 ENCOUNTER — Other Ambulatory Visit: Payer: Self-pay | Admitting: Podiatry

## 2019-03-03 DIAGNOSIS — Z789 Other specified health status: Secondary | ICD-10-CM | POA: Diagnosis not present

## 2019-03-03 DIAGNOSIS — R29898 Other symptoms and signs involving the musculoskeletal system: Secondary | ICD-10-CM | POA: Diagnosis not present

## 2019-03-03 DIAGNOSIS — S82432K Displaced oblique fracture of shaft of left fibula, subsequent encounter for closed fracture with nonunion: Secondary | ICD-10-CM | POA: Diagnosis not present

## 2019-03-03 DIAGNOSIS — M25672 Stiffness of left ankle, not elsewhere classified: Secondary | ICD-10-CM | POA: Diagnosis not present

## 2019-03-03 DIAGNOSIS — R262 Difficulty in walking, not elsewhere classified: Secondary | ICD-10-CM | POA: Diagnosis not present

## 2019-03-03 DIAGNOSIS — S82252K Displaced comminuted fracture of shaft of left tibia, subsequent encounter for closed fracture with nonunion: Secondary | ICD-10-CM | POA: Diagnosis not present

## 2019-03-03 DIAGNOSIS — Z7409 Other reduced mobility: Secondary | ICD-10-CM | POA: Diagnosis not present

## 2019-03-05 DIAGNOSIS — S82432K Displaced oblique fracture of shaft of left fibula, subsequent encounter for closed fracture with nonunion: Secondary | ICD-10-CM | POA: Diagnosis not present

## 2019-03-05 DIAGNOSIS — R262 Difficulty in walking, not elsewhere classified: Secondary | ICD-10-CM | POA: Diagnosis not present

## 2019-03-05 DIAGNOSIS — Z7409 Other reduced mobility: Secondary | ICD-10-CM | POA: Diagnosis not present

## 2019-03-05 DIAGNOSIS — R29898 Other symptoms and signs involving the musculoskeletal system: Secondary | ICD-10-CM | POA: Diagnosis not present

## 2019-03-05 DIAGNOSIS — M25672 Stiffness of left ankle, not elsewhere classified: Secondary | ICD-10-CM | POA: Diagnosis not present

## 2019-03-05 DIAGNOSIS — S82252K Displaced comminuted fracture of shaft of left tibia, subsequent encounter for closed fracture with nonunion: Secondary | ICD-10-CM | POA: Diagnosis not present

## 2019-03-05 DIAGNOSIS — Z789 Other specified health status: Secondary | ICD-10-CM | POA: Diagnosis not present

## 2019-03-05 DIAGNOSIS — H43811 Vitreous degeneration, right eye: Secondary | ICD-10-CM | POA: Diagnosis not present

## 2019-03-10 DIAGNOSIS — Z7409 Other reduced mobility: Secondary | ICD-10-CM | POA: Diagnosis not present

## 2019-03-10 DIAGNOSIS — R262 Difficulty in walking, not elsewhere classified: Secondary | ICD-10-CM | POA: Diagnosis not present

## 2019-03-10 DIAGNOSIS — L409 Psoriasis, unspecified: Secondary | ICD-10-CM | POA: Diagnosis not present

## 2019-03-10 DIAGNOSIS — I872 Venous insufficiency (chronic) (peripheral): Secondary | ICD-10-CM | POA: Diagnosis not present

## 2019-03-10 DIAGNOSIS — S82432K Displaced oblique fracture of shaft of left fibula, subsequent encounter for closed fracture with nonunion: Secondary | ICD-10-CM | POA: Diagnosis not present

## 2019-03-10 DIAGNOSIS — G4730 Sleep apnea, unspecified: Secondary | ICD-10-CM | POA: Diagnosis not present

## 2019-03-10 DIAGNOSIS — Z1331 Encounter for screening for depression: Secondary | ICD-10-CM | POA: Diagnosis not present

## 2019-03-10 DIAGNOSIS — Z789 Other specified health status: Secondary | ICD-10-CM | POA: Diagnosis not present

## 2019-03-10 DIAGNOSIS — M25672 Stiffness of left ankle, not elsewhere classified: Secondary | ICD-10-CM | POA: Diagnosis not present

## 2019-03-10 DIAGNOSIS — S82252K Displaced comminuted fracture of shaft of left tibia, subsequent encounter for closed fracture with nonunion: Secondary | ICD-10-CM | POA: Diagnosis not present

## 2019-03-10 DIAGNOSIS — I10 Essential (primary) hypertension: Secondary | ICD-10-CM | POA: Diagnosis not present

## 2019-03-10 DIAGNOSIS — R29898 Other symptoms and signs involving the musculoskeletal system: Secondary | ICD-10-CM | POA: Diagnosis not present

## 2019-03-12 DIAGNOSIS — S82252K Displaced comminuted fracture of shaft of left tibia, subsequent encounter for closed fracture with nonunion: Secondary | ICD-10-CM | POA: Diagnosis not present

## 2019-03-12 DIAGNOSIS — S82432K Displaced oblique fracture of shaft of left fibula, subsequent encounter for closed fracture with nonunion: Secondary | ICD-10-CM | POA: Diagnosis not present

## 2019-03-12 DIAGNOSIS — R262 Difficulty in walking, not elsewhere classified: Secondary | ICD-10-CM | POA: Diagnosis not present

## 2019-03-12 DIAGNOSIS — Z789 Other specified health status: Secondary | ICD-10-CM | POA: Diagnosis not present

## 2019-03-12 DIAGNOSIS — Z7409 Other reduced mobility: Secondary | ICD-10-CM | POA: Diagnosis not present

## 2019-03-12 DIAGNOSIS — R29898 Other symptoms and signs involving the musculoskeletal system: Secondary | ICD-10-CM | POA: Diagnosis not present

## 2019-03-12 DIAGNOSIS — M25672 Stiffness of left ankle, not elsewhere classified: Secondary | ICD-10-CM | POA: Diagnosis not present

## 2019-03-13 DIAGNOSIS — R69 Illness, unspecified: Secondary | ICD-10-CM | POA: Diagnosis not present

## 2019-03-19 DIAGNOSIS — S82432K Displaced oblique fracture of shaft of left fibula, subsequent encounter for closed fracture with nonunion: Secondary | ICD-10-CM | POA: Diagnosis not present

## 2019-03-19 DIAGNOSIS — Z7409 Other reduced mobility: Secondary | ICD-10-CM | POA: Diagnosis not present

## 2019-03-19 DIAGNOSIS — M25672 Stiffness of left ankle, not elsewhere classified: Secondary | ICD-10-CM | POA: Diagnosis not present

## 2019-03-19 DIAGNOSIS — S82252K Displaced comminuted fracture of shaft of left tibia, subsequent encounter for closed fracture with nonunion: Secondary | ICD-10-CM | POA: Diagnosis not present

## 2019-03-19 DIAGNOSIS — Z789 Other specified health status: Secondary | ICD-10-CM | POA: Diagnosis not present

## 2019-03-19 DIAGNOSIS — R262 Difficulty in walking, not elsewhere classified: Secondary | ICD-10-CM | POA: Diagnosis not present

## 2019-03-19 DIAGNOSIS — R29898 Other symptoms and signs involving the musculoskeletal system: Secondary | ICD-10-CM | POA: Diagnosis not present

## 2019-03-24 DIAGNOSIS — S82252K Displaced comminuted fracture of shaft of left tibia, subsequent encounter for closed fracture with nonunion: Secondary | ICD-10-CM | POA: Diagnosis not present

## 2019-03-24 DIAGNOSIS — R262 Difficulty in walking, not elsewhere classified: Secondary | ICD-10-CM | POA: Diagnosis not present

## 2019-03-24 DIAGNOSIS — Z789 Other specified health status: Secondary | ICD-10-CM | POA: Diagnosis not present

## 2019-03-24 DIAGNOSIS — M25672 Stiffness of left ankle, not elsewhere classified: Secondary | ICD-10-CM | POA: Diagnosis not present

## 2019-03-24 DIAGNOSIS — Z7409 Other reduced mobility: Secondary | ICD-10-CM | POA: Diagnosis not present

## 2019-03-24 DIAGNOSIS — S82432K Displaced oblique fracture of shaft of left fibula, subsequent encounter for closed fracture with nonunion: Secondary | ICD-10-CM | POA: Diagnosis not present

## 2019-03-24 DIAGNOSIS — R29898 Other symptoms and signs involving the musculoskeletal system: Secondary | ICD-10-CM | POA: Diagnosis not present

## 2019-03-26 DIAGNOSIS — M25672 Stiffness of left ankle, not elsewhere classified: Secondary | ICD-10-CM | POA: Diagnosis not present

## 2019-03-26 DIAGNOSIS — S82432K Displaced oblique fracture of shaft of left fibula, subsequent encounter for closed fracture with nonunion: Secondary | ICD-10-CM | POA: Diagnosis not present

## 2019-03-26 DIAGNOSIS — Z789 Other specified health status: Secondary | ICD-10-CM | POA: Diagnosis not present

## 2019-03-26 DIAGNOSIS — Z7409 Other reduced mobility: Secondary | ICD-10-CM | POA: Diagnosis not present

## 2019-03-26 DIAGNOSIS — R262 Difficulty in walking, not elsewhere classified: Secondary | ICD-10-CM | POA: Diagnosis not present

## 2019-03-26 DIAGNOSIS — S82252K Displaced comminuted fracture of shaft of left tibia, subsequent encounter for closed fracture with nonunion: Secondary | ICD-10-CM | POA: Diagnosis not present

## 2019-03-26 DIAGNOSIS — R29898 Other symptoms and signs involving the musculoskeletal system: Secondary | ICD-10-CM | POA: Diagnosis not present

## 2019-03-27 ENCOUNTER — Ambulatory Visit: Payer: 59

## 2019-04-05 ENCOUNTER — Ambulatory Visit: Payer: Medicare HMO | Attending: Internal Medicine

## 2019-04-05 DIAGNOSIS — Z23 Encounter for immunization: Secondary | ICD-10-CM | POA: Insufficient documentation

## 2019-04-05 NOTE — Progress Notes (Signed)
   Covid-19 Vaccination Clinic  Name:  Steven Martin    MRN: [id number] DOB: [birth year]  04/05/2019  [name] was observed post Covid-19 immunization for 15 minutes without incidence. He was provided with Vaccine Information Sheet and instruction to access the V-Safe system.   [name] was instructed to call 911 with any severe reactions post vaccine: Marland Kitchen Difficulty breathing  . Swelling of your face and throat  . A fast heartbeat  . A bad rash all over your body  . Dizziness and weakness    Immunizations Administered    Name Date Dose VIS Date Route   Pfizer COVID-19 Vaccine 04/05/2019  9:33 AM 0.3 mL 02/07/2019 Intramuscular   Manufacturer: [employer], [employer]   Lot: [id number]   NDC: [address]-7

## 2019-04-17 ENCOUNTER — Ambulatory Visit: Payer: 59

## 2019-04-22 DIAGNOSIS — G4700 Insomnia, unspecified: Secondary | ICD-10-CM | POA: Diagnosis not present

## 2019-04-22 DIAGNOSIS — R69 Illness, unspecified: Secondary | ICD-10-CM | POA: Diagnosis not present

## 2019-04-22 DIAGNOSIS — N529 Male erectile dysfunction, unspecified: Secondary | ICD-10-CM | POA: Diagnosis not present

## 2019-04-22 DIAGNOSIS — I10 Essential (primary) hypertension: Secondary | ICD-10-CM | POA: Diagnosis not present

## 2019-04-22 DIAGNOSIS — Z9181 History of falling: Secondary | ICD-10-CM | POA: Diagnosis not present

## 2019-04-22 DIAGNOSIS — Z87891 Personal history of nicotine dependence: Secondary | ICD-10-CM | POA: Diagnosis not present

## 2019-04-30 ENCOUNTER — Ambulatory Visit: Payer: Medicare HMO | Attending: Internal Medicine

## 2019-04-30 DIAGNOSIS — Z23 Encounter for immunization: Secondary | ICD-10-CM | POA: Insufficient documentation

## 2019-04-30 NOTE — Progress Notes (Signed)
   Covid-19 Vaccination Clinic  Name:  Steven Martin    MRN: [id number] DOB: 04/06/1950  04/30/2019  [name] was observed post Covid-19 immunization for 15 minutes without incident. He was provided with Vaccine Information Sheet and instruction to access the V-Safe system.   [name] was instructed to call 911 with any severe reactions post vaccine: . Difficulty breathing  . Swelling of face and throat  . A fast heartbeat  . A bad rash all over body  . Dizziness and weakness   Immunizations Administered    Name Date Dose VIS Date Route   Pfizer COVID-19 Vaccine 04/30/2019  8:38 AM 0.3 mL 02/07/2019 Intramuscular   Manufacturer: Pfizer, Inc   Lot: [id number]   NDC: [address]-2     

## 2019-04-30 NOTE — Progress Notes (Signed)
   Covid-19 Vaccination Clinic  Name:  Steven Martin    MRN: [id number] DOB: 12-03-1950  04/30/2019  [name] was observed post Covid-19 immunization for 15 minutes without incident. He was provided with Vaccine Information Sheet and instruction to access the V-Safe system.   [name] was instructed to call 911 with any severe reactions post vaccine: Marland Kitchen Difficulty breathing  . Swelling of face and throat  . A fast heartbeat  . A bad rash all over body  . Dizziness and weakness   Immunizations Administered    Name Date Dose VIS Date Route   Pfizer COVID-19 Vaccine 04/30/2019  8:38 AM 0.3 mL 02/07/2019 Intramuscular   Manufacturer: [employer], [employer]   Lot: [id number]   NDC: [address]-9

## 2019-05-08 DIAGNOSIS — S82432K Displaced oblique fracture of shaft of left fibula, subsequent encounter for closed fracture with nonunion: Secondary | ICD-10-CM | POA: Diagnosis not present

## 2019-05-08 DIAGNOSIS — S82252K Displaced comminuted fracture of shaft of left tibia, subsequent encounter for closed fracture with nonunion: Secondary | ICD-10-CM | POA: Diagnosis not present

## 2019-06-04 DIAGNOSIS — L218 Other seborrheic dermatitis: Secondary | ICD-10-CM | POA: Diagnosis not present

## 2019-06-04 DIAGNOSIS — L400 Psoriasis vulgaris: Secondary | ICD-10-CM | POA: Diagnosis not present

## 2019-06-19 DIAGNOSIS — S82252K Displaced comminuted fracture of shaft of left tibia, subsequent encounter for closed fracture with nonunion: Secondary | ICD-10-CM | POA: Diagnosis not present

## 2019-06-19 DIAGNOSIS — S82432K Displaced oblique fracture of shaft of left fibula, subsequent encounter for closed fracture with nonunion: Secondary | ICD-10-CM | POA: Diagnosis not present

## 2019-06-23 ENCOUNTER — Ambulatory Visit (INDEPENDENT_AMBULATORY_CARE_PROVIDER_SITE_OTHER)
Admission: RE | Admit: 2019-06-23 | Discharge: 2019-06-23 | Disposition: A | Discharge status: Home / Self Care | Payer: Medicare HMO | Source: Ambulatory Visit | Attending: Internal Medicine | Admitting: Internal Medicine

## 2019-06-23 ENCOUNTER — Other Ambulatory Visit: Payer: Self-pay

## 2019-06-23 DIAGNOSIS — R911 Solitary pulmonary nodule: Secondary | ICD-10-CM | POA: Diagnosis not present

## 2019-06-23 DIAGNOSIS — J849 Interstitial pulmonary disease, unspecified: Secondary | ICD-10-CM | POA: Diagnosis not present

## 2019-06-23 DIAGNOSIS — Z77090 Contact with and (suspected) exposure to asbestos: Secondary | ICD-10-CM

## 2019-06-26 ENCOUNTER — Encounter: Payer: Self-pay | Admitting: Internal Medicine

## 2019-06-26 ENCOUNTER — Other Ambulatory Visit: Payer: Self-pay

## 2019-06-26 ENCOUNTER — Ambulatory Visit (INDEPENDENT_AMBULATORY_CARE_PROVIDER_SITE_OTHER): Payer: Medicare HMO | Admitting: Internal Medicine

## 2019-06-26 VITALS — BP 138/86 | HR 102 | Temp 98.3°F | Ht 75.0 in | Wt 349.0 lb

## 2019-06-26 DIAGNOSIS — F5101 Primary insomnia: Secondary | ICD-10-CM

## 2019-06-26 DIAGNOSIS — J849 Interstitial pulmonary disease, unspecified: Secondary | ICD-10-CM | POA: Diagnosis not present

## 2019-06-26 DIAGNOSIS — Z77090 Contact with and (suspected) exposure to asbestos: Secondary | ICD-10-CM | POA: Diagnosis not present

## 2019-06-26 DIAGNOSIS — G4733 Obstructive sleep apnea (adult) (pediatric): Secondary | ICD-10-CM | POA: Diagnosis not present

## 2019-06-26 DIAGNOSIS — R69 Illness, unspecified: Secondary | ICD-10-CM | POA: Diagnosis not present

## 2019-06-26 MED ORDER — ZALEPLON 10 MG PO CAPS: 10.0000 mg | ORAL_CAPSULE | Freq: Every evening | ORAL | 5 refills | Status: DC | PRN

## 2019-06-26 NOTE — Progress Notes (Signed)
Patient ID: Steven Martin, male    DOB: 01/01/1951, 69 y.o.   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 Maple Hudson, I as an employee of [employer] have been exposed to Asbestos related products while working there over the past 48 years. As part of a Class Action Lawsuit against the companies who manufactured those products and also my employer who exposed me to them, I have been issued a payment instructions card by my employer to cover payment for certain tests I must have conducted now and in the future.  I am required as part of the agreement of this lawsuit decision, to have certain pulmonary tests conducted yearly. They are a Pulmonary Functions Test and a continuous non-contrast routine spiral CT of my chest.  As my pulmonary physician, I am asking if you and the [facility] practice will conduct these yearly test on my lungs. I have attached a picture copy of the payment instructions card issued to me for your consideration to use for your practice's reimbursement for these tests.  If this yearly testing is agreeable by you and [place], I would like to schedule these tests as soon as I can since I was instructed to have them done last November.  We can talk more about this during our "phone visit" scheduled for April 29th at 9:00 a.m. Thank you.  Attachments Image 1 ) -----  From: Steven Martin  Sent: 06/26/2018 10:48 AM EDT  To: [facility]  Subject: Visit Follow-Up Question               This is the name and address of the law firm that Dr. Maple Hudson requested of me to send to him during our teleconference office visit today, April 29th.   [name]    [address]    [place], [place] [address]    [phone]   They should receive the results of my yearly Pulmonary Functions Test and C-T scan that  should be completed every other year, per Dr. [name] instructions because of the radiation exposure from the test.   Please let me know if I need to provide anything else.   -------------------------------------------------------------  06/26/2018- Virtual Visit via Telephone Note  History of Present Illness: 69 year old male former smoker followed for OSA, Asbestos exposure,  complicated by insomnia, HBP, psoriasis/ Humira,  obesity CPAP auto 10-20/Adapt He has also asked [place] to provide the yearly PFT and CT chest for his asbestos surveillance.  -----breathing as baseline, see MyChart email, OSA on CPAP, works well for him CPAP machine replaced last year and doing well, confirmed by good download results.  Asbestos exposure-[employer] > 40 years. Legal settlement managed through [place] [employer] [place], [employer]. He had his initial evaluation at a hotel site in [place], with doctor from [facility]. He has those records and will bring at next in-person ov. He says minimal fibrosis seen. He denies dyspnea, chest pains, blood. I discussed other non-pulmonary and esp GI cancers as rare but possible, outside of our scope at this practice.    Observations/Objective: NPSG 06/26/94 RDI 35/hr Download 97% compliance, AHI 4.1 on auto 10-20   Assessment and Plan: OSA- good compliance and control- no change Asbestos exposure- scheduling annual PFT and probably get noncontrast CT chest every other year to minimize radiation exposure.  Follow Up Instructions:  OV in 1 year. Scheduling PFT and CT as available under Covid rules.  Baird Lyons, MD  06/26/19- 69 year old male former smoker followed for OSA, Asbestos exposure,  complicated by insomnia, HBP, psoriasis/ Humira,  obesity CPAP auto 10-20/Adapt Download compliance 90%, AHI 3.1/ hr Body weight today 349 lbs ----pt uses xanax for sleep wears cpap every night   Temazepam 15 mg x 2 "some help".  Previously failed Ambien, clonazepam, trazodone He has also  asked [place] to provide the yearly PFT and CT chest for his asbestos surveillance. Since last here he fx'd L lower leg,with repair.  Comfortable with CPAP. Still c/o WASO, with nocturia x2 addressed by Urology. Using Xanax 0.5 mg x 2, 3-4 nights/ week. Discussed CT chest- stable nonspecific fibrosis- asbestos not ruled out by Radiology. CT chest 06/23/19- IMPRESSION: 1. Pulmonary parenchymal pattern of interstitial lung disease appears unchanged from 07/01/2018 and may be due to usual interstitial pneumonitis or nonspecific interstitial pneumonitis. Findings are indeterminate for UIP per consensus guidelines: Diagnosis of Idiopathic Pulmonary Fibrosis: An Official ATS/ERS/JRS/ALAT Clinical Practice Guideline. [place], Iss 5, [phone], Oct 28 2016. 2. Minimal air trapping can be seen with small airways disease. 3. Stable 7 mm subpleural nodule along the right major fissure, likely a benign subpleural lymph node. Suggest additional follow-up in 1 year for 2 years of documented stability. This recommendation follows the consensus statement: Guidelines for Management of Small Pulmonary Nodules Detected on CT Images: From the Fleischner Society 2017; Radiology 2017; 284:228-243. 4. Question cirrhosis. 5. Enlarged pulmonary arteries, indicative of pulmonary arterial Hypertension.  PFT 07/31/2018- WNL-   ROS-see HPI    + = positive Constitutional:   No-   weight loss, night sweats, fevers, chills, fatigue, lassitude. HEENT:   No-  headaches, difficulty swallowing, tooth/dental problems, sore throat,       No- sneezing, itching, ear ache, nasal congestion, post nasal drip,  CV:  No-   chest pain, orthopnea, PND, swelling in lower extremities, anasarca, dizziness, palpitations Resp: No-   shortness of breath with exertion or at rest.              No-   productive cough,  No non-productive cough,  No- coughing up of blood.              No-   change in color of mucus.  No-  wheezing.   Skin: No-   rash or lesions. GI:   GU:  MS:  No-   joint pain or swelling.   Neuro-     nothing unusual Psych:  No- change in mood or affect. No depression or anxiety.  No memory loss.  OBJ- Physical Exam General- Alert, Oriented, Affect-appropriate, Distress- none acute, + big man/obese Skin- +stasis changes lower legs Lymphadenopathy- none Head- atraumatic            Eyes- Gross vision intact, PERRLA, conjunctivae and secretions clear            Ears- Hearing, canals-normal            Nose- crusting, no-Septal dev, mucus, polyps, erosion, perforation             Throat- Mallampati II , mucosa clear , drainage- none, tonsils- atrophic Neck- flexible , trachea midline, no stridor , thyroid nl, carotid no bruit Chest - symmetrical excursion , unlabored           Heart/CV- RRR , no murmur , no gallop  , no rub,  nl s1 s2                           - JVD- none , edema- none, stasis changes- none, varices- none           Lung- clear to P&A, wheeze- none, cough- none , dullness-none, rub- none           Chest wall-  Abd-  Br/ Gen/ Rectal- Not done, not indicated Extrem- cyanosis- none, clubbing, none, atrophy- none, strength- nl Neuro- grossly intact to observation

## 2019-06-26 NOTE — Patient Instructions (Signed)
Order- Schedule future (1 year) CT chest High Resolution, no contrast    Dx ILD, Asbestos exposure  Schedule future (1 year) PFT   Dx asbestos exposure  Script sent to try Sonata 10 mg    If you wake during the night with difficulty getting back to sleep.  We anticipate getting a PFT every 2 years.   We will decide next year about a follow-up CT  Please call if we can help

## 2019-06-27 ENCOUNTER — Telehealth: Payer: Self-pay | Admitting: Internal Medicine

## 2019-06-27 NOTE — Telephone Encounter (Signed)
Medication name and strength: Zaleplon 10mg  Provider: [facility] Patient insurance ID: [facility] Phone: [phone] Fax: [phone]  Was the PA started on CMM?  yes If yes, please enter the Key: [facility] Timeframe for approval/denial: Your information has been submitted to [employer]. [employer] will review the request and will issue a decision, typically within 1-3 days from your submission. You can check the updated outcome later by reopening this request.  If [employer] has not responded in 1-3 days or if you have any questions about your ePA request, please contact [employer] at [phone]. If you think there may be a problem with your PA request, use our live chat feature at the bottom right.  Routing to [place] for her to follow up on

## 2019-06-28 NOTE — Assessment & Plan Note (Signed)
Benefits from CPAP with good compliance and control Plan- continue auto 10-20 

## 2019-06-28 NOTE — Assessment & Plan Note (Signed)
We have discussed sleep hygiene and options. Plan- try short half-life med [address]

## 2019-06-28 NOTE — Assessment & Plan Note (Addendum)
Report of current CT notes nonspecific ILD, not referencing any features such as pleural plaques directly suggesting asbestosis.  Plan- We will repeat CT in 1 year- discussed Anticipate repeat PFT every 2 years for now.

## 2019-06-30 ENCOUNTER — Other Ambulatory Visit: Payer: Self-pay

## 2019-06-30 NOTE — Telephone Encounter (Signed)
Received a fax from [facility] stating that the PA has been approved. PA has been approved from 02/28/2019 to 02/27/2020.   [facility] is aware [facility].

## 2019-07-03 ENCOUNTER — Encounter: Payer: Self-pay | Admitting: *Deleted

## 2019-07-03 ENCOUNTER — Telehealth: Payer: Self-pay | Admitting: Internal Medicine

## 2019-07-03 NOTE — Telephone Encounter (Addendum)
Called and spoke with patient  he states he recently saw Dr. Maple Hudson and they went over results then and they were also printed off and given to him. Informed patient that I would tell the staff here that he has been notified. Patient expressed understanding. Nothing further needed at this time.

## 2019-07-23 DIAGNOSIS — G4733 Obstructive sleep apnea (adult) (pediatric): Secondary | ICD-10-CM | POA: Diagnosis not present

## 2019-07-31 DIAGNOSIS — S82432K Displaced oblique fracture of shaft of left fibula, subsequent encounter for closed fracture with nonunion: Secondary | ICD-10-CM | POA: Diagnosis not present

## 2019-07-31 DIAGNOSIS — S82252K Displaced comminuted fracture of shaft of left tibia, subsequent encounter for closed fracture with nonunion: Secondary | ICD-10-CM | POA: Diagnosis not present

## 2019-08-23 DIAGNOSIS — G4733 Obstructive sleep apnea (adult) (pediatric): Secondary | ICD-10-CM | POA: Diagnosis not present

## 2019-09-03 DIAGNOSIS — Z79899 Other long term (current) drug therapy: Secondary | ICD-10-CM | POA: Diagnosis not present

## 2019-09-03 DIAGNOSIS — L400 Psoriasis vulgaris: Secondary | ICD-10-CM | POA: Diagnosis not present

## 2019-09-12 DIAGNOSIS — S82432K Displaced oblique fracture of shaft of left fibula, subsequent encounter for closed fracture with nonunion: Secondary | ICD-10-CM | POA: Diagnosis not present

## 2019-09-12 DIAGNOSIS — S82252K Displaced comminuted fracture of shaft of left tibia, subsequent encounter for closed fracture with nonunion: Secondary | ICD-10-CM | POA: Diagnosis not present

## 2019-09-12 DIAGNOSIS — Z4789 Encounter for other orthopedic aftercare: Secondary | ICD-10-CM | POA: Insufficient documentation

## 2019-09-17 DIAGNOSIS — E291 Testicular hypofunction: Secondary | ICD-10-CM | POA: Diagnosis not present

## 2019-09-17 DIAGNOSIS — Z125 Encounter for screening for malignant neoplasm of prostate: Secondary | ICD-10-CM | POA: Diagnosis not present

## 2019-09-17 DIAGNOSIS — Z0000 Encounter for general adult medical examination without abnormal findings: Secondary | ICD-10-CM | POA: Diagnosis not present

## 2019-09-17 DIAGNOSIS — I10 Essential (primary) hypertension: Secondary | ICD-10-CM | POA: Diagnosis not present

## 2019-09-17 DIAGNOSIS — E559 Vitamin D deficiency, unspecified: Secondary | ICD-10-CM | POA: Diagnosis not present

## 2019-09-22 DIAGNOSIS — G4733 Obstructive sleep apnea (adult) (pediatric): Secondary | ICD-10-CM | POA: Diagnosis not present

## 2019-09-24 DIAGNOSIS — Z0000 Encounter for general adult medical examination without abnormal findings: Secondary | ICD-10-CM | POA: Diagnosis not present

## 2019-09-24 DIAGNOSIS — R82998 Other abnormal findings in urine: Secondary | ICD-10-CM | POA: Diagnosis not present

## 2019-09-24 DIAGNOSIS — R600 Localized edema: Secondary | ICD-10-CM | POA: Diagnosis not present

## 2019-09-24 DIAGNOSIS — Z1331 Encounter for screening for depression: Secondary | ICD-10-CM | POA: Diagnosis not present

## 2019-09-24 DIAGNOSIS — Z1339 Encounter for screening examination for other mental health and behavioral disorders: Secondary | ICD-10-CM | POA: Diagnosis not present

## 2019-09-24 DIAGNOSIS — I872 Venous insufficiency (chronic) (peripheral): Secondary | ICD-10-CM | POA: Diagnosis not present

## 2019-09-24 DIAGNOSIS — G4730 Sleep apnea, unspecified: Secondary | ICD-10-CM | POA: Diagnosis not present

## 2019-09-24 DIAGNOSIS — N1831 Chronic kidney disease, stage 3a: Secondary | ICD-10-CM | POA: Diagnosis not present

## 2019-09-24 DIAGNOSIS — L409 Psoriasis, unspecified: Secondary | ICD-10-CM | POA: Diagnosis not present

## 2019-09-24 DIAGNOSIS — E291 Testicular hypofunction: Secondary | ICD-10-CM | POA: Diagnosis not present

## 2019-09-24 DIAGNOSIS — I129 Hypertensive chronic kidney disease with stage 1 through stage 4 chronic kidney disease, or unspecified chronic kidney disease: Secondary | ICD-10-CM | POA: Diagnosis not present

## 2019-09-24 DIAGNOSIS — I10 Essential (primary) hypertension: Secondary | ICD-10-CM | POA: Diagnosis not present

## 2019-09-24 DIAGNOSIS — M1990 Unspecified osteoarthritis, unspecified site: Secondary | ICD-10-CM | POA: Diagnosis not present

## 2019-10-02 DIAGNOSIS — Z1212 Encounter for screening for malignant neoplasm of rectum: Secondary | ICD-10-CM | POA: Diagnosis not present

## 2019-10-28 DIAGNOSIS — L400 Psoriasis vulgaris: Secondary | ICD-10-CM | POA: Diagnosis not present

## 2019-10-28 DIAGNOSIS — Z79899 Other long term (current) drug therapy: Secondary | ICD-10-CM | POA: Diagnosis not present

## 2019-11-18 DIAGNOSIS — R69 Illness, unspecified: Secondary | ICD-10-CM | POA: Diagnosis not present

## 2019-11-27 DIAGNOSIS — L400 Psoriasis vulgaris: Secondary | ICD-10-CM | POA: Diagnosis not present

## 2019-11-27 DIAGNOSIS — Z79899 Other long term (current) drug therapy: Secondary | ICD-10-CM | POA: Diagnosis not present

## 2019-12-29 DIAGNOSIS — L400 Psoriasis vulgaris: Secondary | ICD-10-CM | POA: Diagnosis not present

## 2019-12-29 DIAGNOSIS — L700 Acne vulgaris: Secondary | ICD-10-CM | POA: Diagnosis not present

## 2019-12-29 DIAGNOSIS — Z79899 Other long term (current) drug therapy: Secondary | ICD-10-CM | POA: Diagnosis not present

## 2020-01-08 DIAGNOSIS — Z961 Presence of intraocular lens: Secondary | ICD-10-CM | POA: Diagnosis not present

## 2020-01-08 DIAGNOSIS — H1789 Other corneal scars and opacities: Secondary | ICD-10-CM | POA: Diagnosis not present

## 2020-01-08 DIAGNOSIS — H52203 Unspecified astigmatism, bilateral: Secondary | ICD-10-CM | POA: Diagnosis not present

## 2020-01-28 DIAGNOSIS — L400 Psoriasis vulgaris: Secondary | ICD-10-CM | POA: Diagnosis not present

## 2020-01-28 DIAGNOSIS — Z79899 Other long term (current) drug therapy: Secondary | ICD-10-CM | POA: Diagnosis not present

## 2020-01-29 DIAGNOSIS — L400 Psoriasis vulgaris: Secondary | ICD-10-CM | POA: Diagnosis not present

## 2020-01-29 DIAGNOSIS — Z79899 Other long term (current) drug therapy: Secondary | ICD-10-CM | POA: Diagnosis not present

## 2020-03-09 IMAGING — CT CT CHEST WITHOUT CONTRAST
2 of 3 series · 15 of 36 positions shown, 18 images · non-contrast
Comparison: Chest x-ray dated September 08, 2008.

CLINICAL DATA: Chronic shortness of breath. Asbestos exposure.
Evaluate for interstitial lung disease.

EXAM:
CT CHEST WITHOUT CONTRAST
TECHNIQUE: Multidetector CT imaging of the chest was performed following the
standard protocol without IV contrast.

[Series 2: thorax · axial · 0.87mm/px · z∈[+1138,+1436]mm · 12 of 175 slices shown, 15 images]
[im 13/175  mediastinal]
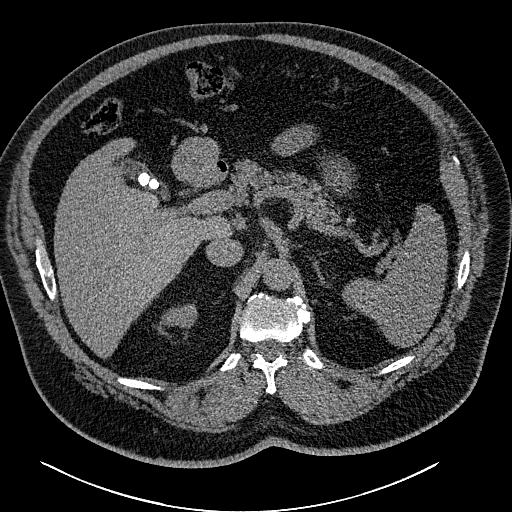
[im 13/175  lung]
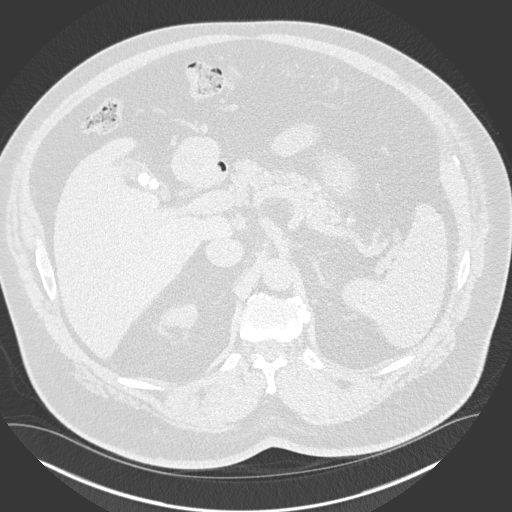
[im 26/175  lung]
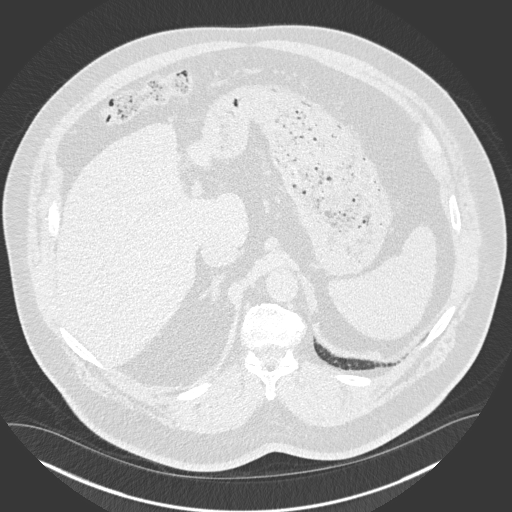
[im 39/175  lung]
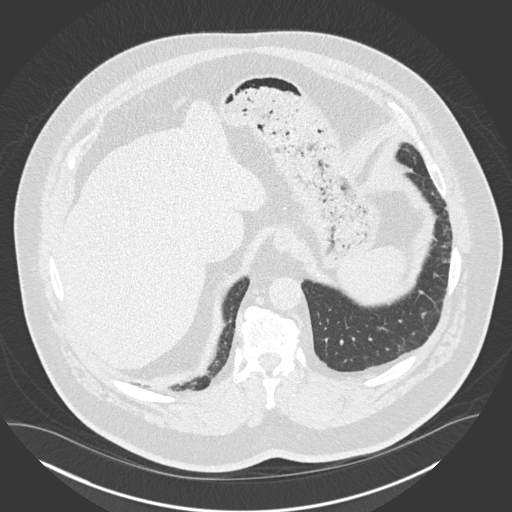
[im 52/175  lung]
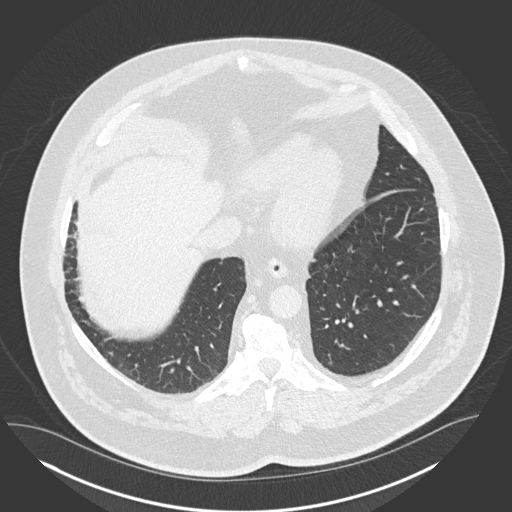
[im 65/175  mediastinal]
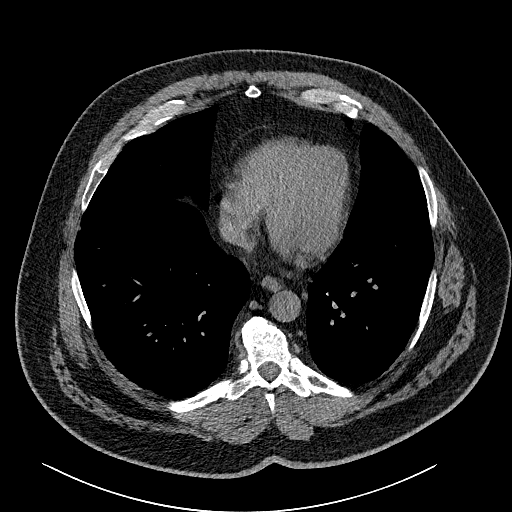
[im 65/175  lung]
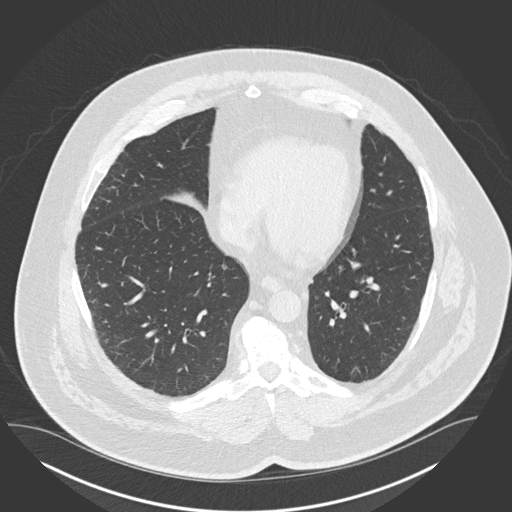
[im 78/175  lung]
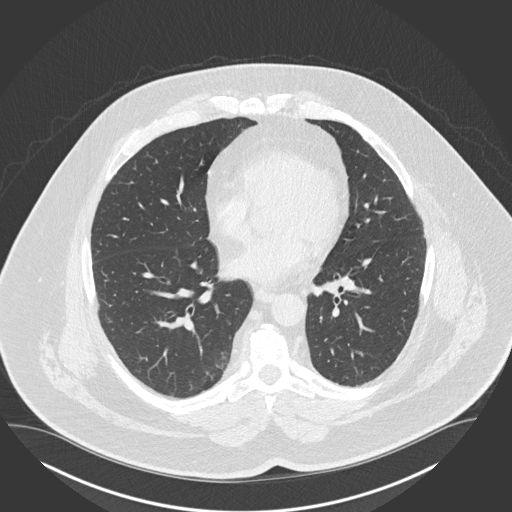
[im 97/175  lung]
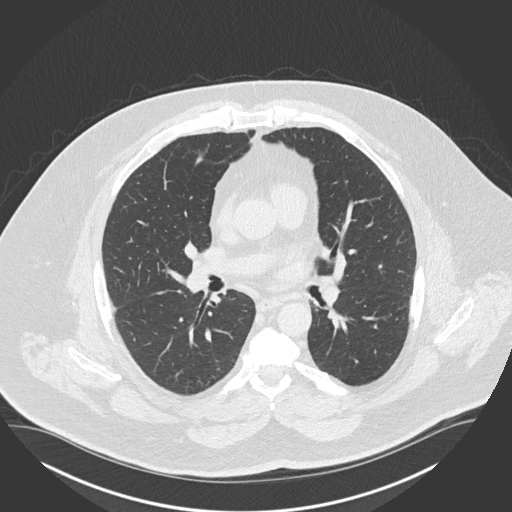
[im 110/175  lung]
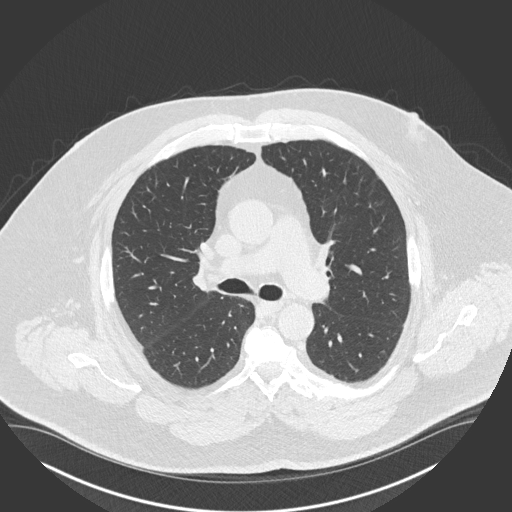
[im 123/175  mediastinal]
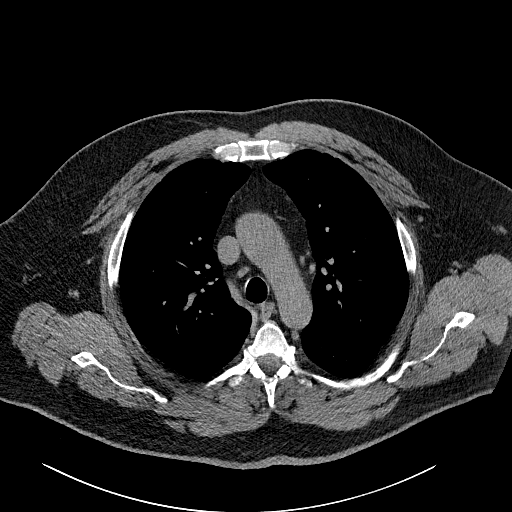
[im 123/175  lung]
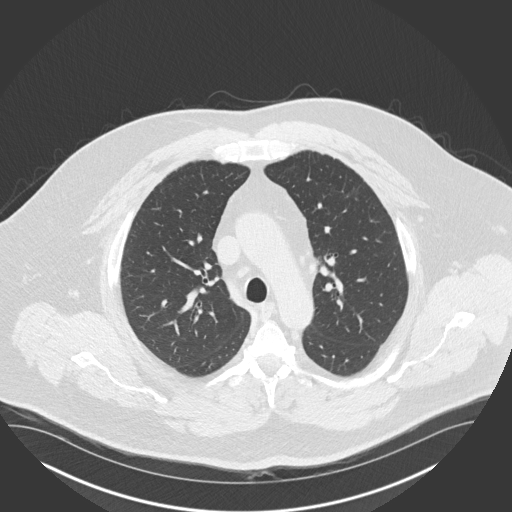
[im 136/175  lung]
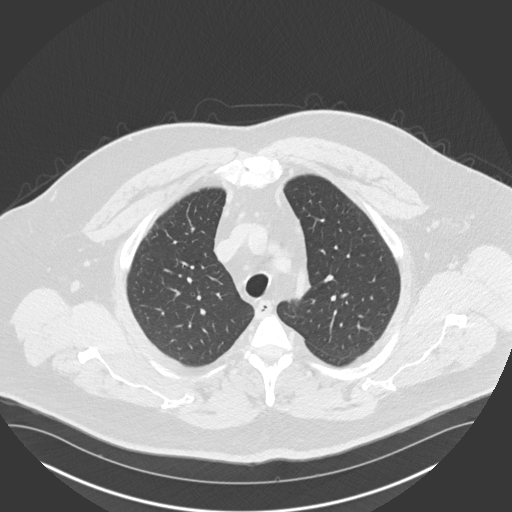
[im 149/175  lung]
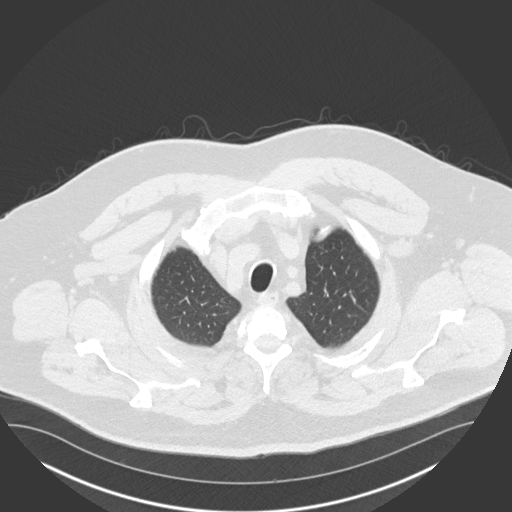
[im 162/175  lung]
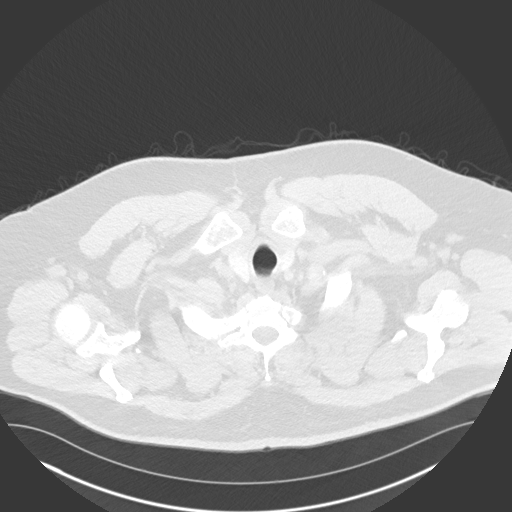

[Series 5: coronal · coronal · 0.68mm/px · 3 of 165 slices shown]
[im 33/165  lung]
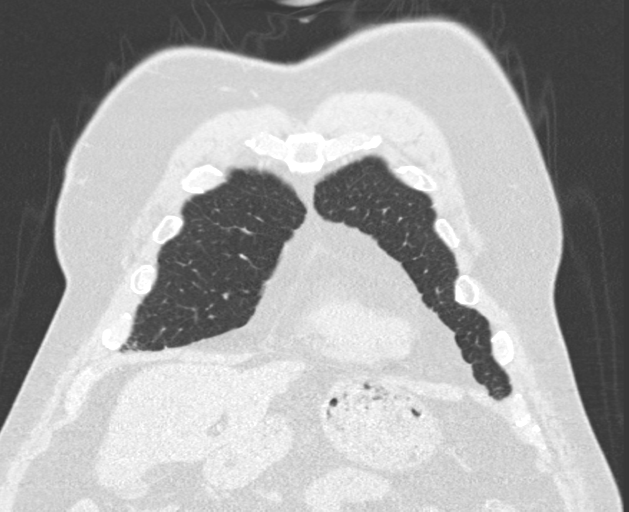
[im 66/165  lung]
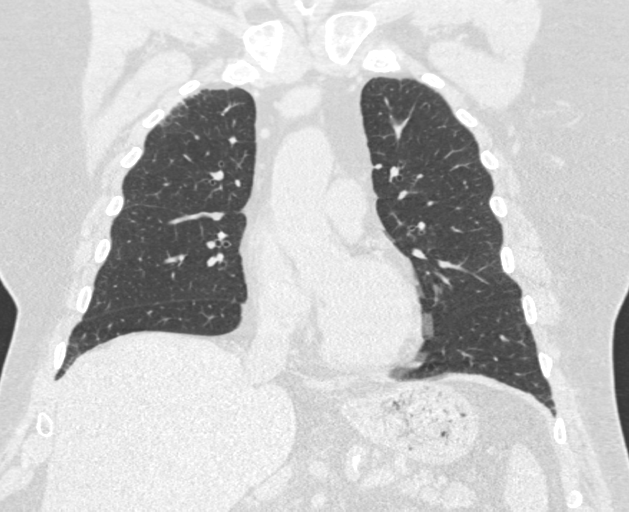
[im 99/165  lung]
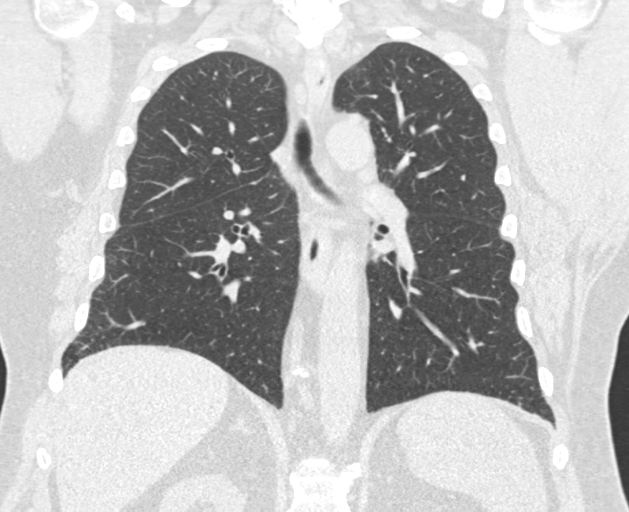

[15 of 36 positions shown; findings below may reference images not displayed]

FINDINGS: Cardiovascular: Normal heart size. No pericardial effusion. No
thoracic aortic aneurysm. Mild left anterior descending coronary
artery atherosclerotic calcification. Normal caliber pulmonary
arteries.

Mediastinum/Nodes: No enlarged mediastinal or axillary lymph nodes.
Thyroid gland, trachea, and esophagus demonstrate no significant
findings.

Lungs/Pleura: There is minimal increased subpleural reticulation at
the right greater than left lung bases. No bronchiectasis. No focal
consolidation, pleural effusion, or pneumothorax. 8 x 8 mm
perifissural nodule in the right lower lobe (series 3, image 81).
Small calcified granulomas in the lingula and left lower lobe. No
additional lung nodules. No pleural thickening or calcified pleural
plaques.

Upper Abdomen: No acute abnormality.  Cholelithiasis.

Musculoskeletal: No chest wall mass or suspicious bone lesions
identified. No acute or significant osseous findings.
IMPRESSION: 1. Minimal increased subpleural reticulation at the right greater
than left lung bases, nonspecific. Consider follow-up
high-resolution chest CT in 6-12 months to evaluate for interval
change and for further characterization.
2. 8 mm perifissural nodule in the right lower lobe. Non-contrast
chest CT at 6-12 months is recommended. If the nodule is stable at
time of repeat CT, then future CT at 18-24 months (from today's
scan) is considered optional for low-risk patients, but is
recommended for high-risk patients. This recommendation follows the
consensus statement: Guidelines for Management of Incidental
Pulmonary Nodules Detected on CT Images: From the [HOSPITAL]
3. Cholelithiasis.

## 2020-03-19 ENCOUNTER — Telehealth: Payer: Self-pay | Admitting: Physician Assistant

## 2020-03-19 NOTE — Telephone Encounter (Signed)
Called to discuss with patient about COVID-19 symptoms and the use of one of the available treatments for those with mild to moderate Covid symptoms and at a high risk of hospitalization.  Pt appears to qualify for outpatient treatment due to co-morbid conditions and/or a member of an at-risk group in accordance with the FDA Emergency Use Authorization.    Symptom onset: 03/03/20  Patient is out of window for monoclonal antibody or antiviral therapy. Precaution reviewed.   [employer]

## 2020-06-28 ENCOUNTER — Other Ambulatory Visit: Payer: Self-pay

## 2020-06-28 ENCOUNTER — Ambulatory Visit (INDEPENDENT_AMBULATORY_CARE_PROVIDER_SITE_OTHER)
Admission: RE | Admit: 2020-06-28 | Discharge: 2020-06-28 | Disposition: A | Discharge status: Home / Self Care | Payer: No Typology Code available for payment source | Source: Ambulatory Visit | Attending: Internal Medicine | Admitting: Internal Medicine

## 2020-06-28 DIAGNOSIS — J849 Interstitial pulmonary disease, unspecified: Secondary | ICD-10-CM | POA: Diagnosis not present

## 2020-07-09 ENCOUNTER — Other Ambulatory Visit: Payer: Self-pay | Admitting: *Deleted

## 2020-07-09 ENCOUNTER — Other Ambulatory Visit (HOSPITAL_COMMUNITY)
Admission: RE | Admit: 2020-07-09 | Discharge: 2020-07-09 | Disposition: A | Discharge status: Home / Self Care | Payer: Medicare Other | Source: Ambulatory Visit | Attending: Internal Medicine | Admitting: Internal Medicine

## 2020-07-09 DIAGNOSIS — Z20822 Contact with and (suspected) exposure to covid-19: Secondary | ICD-10-CM | POA: Diagnosis not present

## 2020-07-09 DIAGNOSIS — Z01812 Encounter for preprocedural laboratory examination: Secondary | ICD-10-CM | POA: Diagnosis present

## 2020-07-09 DIAGNOSIS — J849 Interstitial pulmonary disease, unspecified: Secondary | ICD-10-CM

## 2020-07-10 LAB — SARS CORONAVIRUS 2 (TAT 6-24 HRS): SARS Coronavirus 2: NEGATIVE

## 2020-07-11 NOTE — Progress Notes (Signed)
Patient ID: ROBINSON BRINKLEY, male    DOB: 11-13-1950, 70 y.o.   MRN: [id number]  HPI  male former smoker followed for OSA, asbestos exposure complicated by insomnia, HBP,  NPSG 06/26/94 RDI 35/hr PFT 07/31/2018- WNL PFT 07/12/20- WNL =========================================================  (06/05/2018- Dr. Maple Hudson, I as an employee of [employer] have been exposed to Asbestos related products while working there over the past 48 years. As part of a Class Action Lawsuit against the companies who manufactured those products and also my employer who exposed me to them, I have been issued a payment instructions card by my employer to cover payment for certain tests I must have conducted now and in the future.  I am required as part of the agreement of this lawsuit decision, to have certain pulmonary tests conducted yearly. They are a Pulmonary Functions Test and a continuous non-contrast routine spiral CT of my chest.  As my pulmonary physician, I am asking if you and the [facility] will conduct these yearly test on my lungs. I have attached a picture copy of the payment instructions card issued to me for your consideration to use for your practice's reimbursement for these tests.  If this yearly testing is agreeable by you and [place], I would like to schedule these tests as soon as I can since I was instructed to have them done last November.  We can talk more about this during our "phone visit" scheduled for April 29th at 9:00 a.m. Thank you.  Attachments Image 1 ) -----  From: [name]  Sent: 06/26/2018 10:48 AM EDT  To: [facility]  Subject: Visit Follow-Up Question               This is the name and address of the law firm that Dr. Maple Hudson requested of me to send to him during our teleconference office visit today, April 29th.   [name]    [address]    [place], [place] [address]    [phone]   They should  receive the results of my yearly Pulmonary Functions Test and C-T scan that should be completed every other year, per Dr. [name] instructions because of the radiation exposure from the test.   Please let me know if I need to provide anything else.   -------------------------------------------------------------   06/26/19- 70 year old male former smoker followed for OSA, Asbestos exposure,  complicated by insomnia, HBP, psoriasis/ Humira,  obesity CPAP auto 10-20/Adapt Download compliance 90%, AHI 3.1/ hr Body weight today 349 lbs ----pt uses xanax for sleep wears cpap every night   Temazepam 15 mg x 2 "some help".  Previously failed Ambien, clonazepam, trazodone He has also asked [place] to provide the yearly PFT and CT chest for his asbestos surveillance. Since last here he fx'd L lower leg,with repair.  Comfortable with CPAP. Still c/o WASO, with nocturia x2 addressed by Urology. Using Xanax 0.5 mg x 2, 3-4 nights/ week. Discussed CT chest- stable nonspecific fibrosis- asbestos not ruled out by Radiology. CT chest 06/23/19- IMPRESSION: 1. Pulmonary parenchymal pattern of interstitial lung disease appears unchanged from 07/01/2018 and may be due to usual interstitial pneumonitis or nonspecific interstitial pneumonitis. Findings are indeterminate for UIP per consensus guidelines: Diagnosis of Idiopathic Pulmonary Fibrosis: An Official ATS/ERS/JRS/ALAT Clinical Practice Guideline. Am Rosezetta Schlatter Crit Care Med Vol 198, Iss 5, [phone], Oct 28 2016. 2. Minimal air trapping can be seen with small airways disease. 3. Stable 7 mm  subpleural nodule along the right major fissure, likely a benign subpleural lymph node. Suggest additional follow-up in 1 year for 2 years of documented stability. This recommendation follows the consensus statement: Guidelines for Management of Small Pulmonary Nodules Detected on CT Images: From the Fleischner Society 2017; Radiology 2017; 284:228-243. 4. Question  cirrhosis. 5. Enlarged pulmonary arteries, indicative of pulmonary arterial Hypertension. 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  Nocturia x 3 related to prostate. Gets right back to sleep.  Complains [employer] calls too often for supplies.  Had Covid infection January - no hosp, feels fully recovered.  Now no routine cough, no chest pain or adenopathy.  PFT 07/12/20- WNL HRCT chest 06/28/20- reviewed IMPRESSION: 1. No evidence of asbestos related pleural disease. 2. Mild patchy subpleural reticulation and ground-glass opacity in both lungs with a basilar predominance, without significant traction bronchiectasis or frank honeycombing. No convincing interval progression of these findings. Findings are categorized as probable UIP per consensus guidelines: Diagnosis of Idiopathic Pulmonary Fibrosis: An Official ATS/ERS/JRS/ALAT Clinical Practice Guideline. Am Rosezetta Schlatter Crit Care Med Vol 198, Iss 5, [phone], Oct 28 2016. 3. Suggestion of cirrhosis. 4. Cholelithiasis. 5. Aortic Atherosclerosis (ICD10-I70.0).   ROS-see HPI    + = positive Constitutional:   No-   weight loss, night sweats, fevers, chills, fatigue, lassitude. HEENT:   No-  headaches, difficulty swallowing, tooth/dental problems, sore throat,       No- sneezing, itching, ear ache, nasal congestion, post nasal drip,  CV:  No-   chest pain, orthopnea, PND, swelling in lower extremities, anasarca, dizziness, palpitations Resp: No-   shortness of breath with exertion or at rest.              No-   productive cough,  No non-productive cough,  No- coughing up of blood.               No-   change in color of mucus.  No- wheezing.   Skin: No-   rash or lesions. GI:   GU:  MS:  No-   joint pain or swelling.   Neuro-     nothing unusual Psych:  No- change in mood or affect. No depression or anxiety.  No memory loss.  OBJ- Physical Exam General- Alert, Oriented, Affect-appropriate, Distress- none acute, + big man/obese Skin- +stasis changes lower legs Lymphadenopathy- none Head- atraumatic            Eyes- Gross vision intact, PERRLA, conjunctivae and secretions clear            Ears- Hearing, canals-normal            Nose- crusting, no-Septal dev, mucus, polyps, erosion, perforation             Throat- Mallampati II , mucosa clear , drainage- none, tonsils- atrophic Neck- flexible , trachea midline, no stridor , thyroid nl, carotid no bruit Chest - symmetrical excursion , unlabored           Heart/CV- RRR , no murmur , no gallop  , no rub, nl s1 s2                           -  JVD- none , edema- none, stasis changes- none, varices- none           Lung- clear to P&A, wheeze- none, cough- none , dullness-none, rub- none           Chest wall-  Abd-  Br/ Gen/ Rectal- Not done, not indicated Extrem- cyanosis- none, clubbing, none, atrophy- none, strength- nl Neuro- grossly intact to observation

## 2020-07-12 ENCOUNTER — Ambulatory Visit (INDEPENDENT_AMBULATORY_CARE_PROVIDER_SITE_OTHER): Payer: No Typology Code available for payment source | Admitting: Internal Medicine

## 2020-07-12 ENCOUNTER — Other Ambulatory Visit: Payer: Self-pay

## 2020-07-12 ENCOUNTER — Encounter: Payer: Self-pay | Admitting: Internal Medicine

## 2020-07-12 VITALS — BP 118/72 | HR 80 | Temp 97.4°F | Ht 74.0 in | Wt 342.0 lb

## 2020-07-12 DIAGNOSIS — G4733 Obstructive sleep apnea (adult) (pediatric): Secondary | ICD-10-CM

## 2020-07-12 DIAGNOSIS — Z77090 Contact with and (suspected) exposure to asbestos: Secondary | ICD-10-CM | POA: Diagnosis not present

## 2020-07-12 DIAGNOSIS — J849 Interstitial pulmonary disease, unspecified: Secondary | ICD-10-CM | POA: Diagnosis not present

## 2020-07-12 LAB — PULMONARY FUNCTION TEST
DL/VA % pred: 98 %
DL/VA: 3.93 ml/min/mmHg/L
DLCO cor % pred: 87 %
DLCO cor: 25.81 ml/min/mmHg
DLCO unc % pred: 87 %
DLCO unc: 25.81 ml/min/mmHg
FEF 25-75 Post: 3.88 L/sec
FEF 25-75 Pre: 3.53 L/sec
FEF2575-%Change-Post: 9 %
FEF2575-%Pred-Post: 133 %
FEF2575-%Pred-Pre: 121 %
FEV1-%Change-Post: 3 %
FEV1-%Pred-Post: 92 %
FEV1-%Pred-Pre: 89 %
FEV1-Post: 3.52 L
FEV1-Pre: 3.4 L
FEV1FVC-%Change-Post: 1 %
FEV1FVC-%Pred-Pre: 109 %
FEV6-%Change-Post: 1 %
FEV6-%Pred-Post: 87 %
FEV6-%Pred-Pre: 85 %
FEV6-Post: 4.26 L
FEV6-Pre: 4.2 L
FEV6FVC-%Pred-Post: 105 %
FEV6FVC-%Pred-Pre: 105 %
FVC-%Change-Post: 1 %
FVC-%Pred-Post: 82 %
FVC-%Pred-Pre: 81 %
FVC-Post: 4.26 L
FVC-Pre: 4.2 L
Post FEV1/FVC ratio: 83 %
Post FEV6/FVC ratio: 100 %
Pre FEV1/FVC ratio: 81 %
Pre FEV6/FVC Ratio: 100 %
RV % pred: 96 %
RV: 2.55 L
TLC % pred: 90 %
TLC: 7.12 L

## 2020-07-12 NOTE — Patient Instructions (Signed)
Order- printed Script for CPAP mask of choice, supplies, humidifier, hoses, filters   Dx OSA You can use this as needed working through on-line sources like [email or web address]  Please call if we can help  Order- schedule future HRCT chest in 1 year  Dx asbestosis,  UIP

## 2020-07-12 NOTE — Patient Instructions (Signed)
Full PFT performed today. °

## 2020-07-12 NOTE — Progress Notes (Signed)
Full PFT performed today. °

## 2020-07-13 ENCOUNTER — Encounter: Payer: Self-pay | Admitting: Internal Medicine

## 2020-07-13 NOTE — Assessment & Plan Note (Signed)
Stable by PFT and HRCT this month. Imaging falls within UIP pattern by current terminology, with no evidence of malignancy. Without progression we won't use antifibrotics as discussed.  Plan- Anticipate PFT every other year, CT annually for now.

## 2020-07-13 NOTE — Assessment & Plan Note (Signed)
Benefits from CPAP Plan- continue auto 10-20 

## 2021-07-11 ENCOUNTER — Ambulatory Visit (INDEPENDENT_AMBULATORY_CARE_PROVIDER_SITE_OTHER)
Admission: RE | Admit: 2021-07-11 | Discharge: 2021-07-11 | Disposition: A | Discharge status: Home / Self Care | Payer: Worker's Compensation | Source: Ambulatory Visit | Attending: Internal Medicine | Admitting: Internal Medicine

## 2021-07-11 DIAGNOSIS — Z77090 Contact with and (suspected) exposure to asbestos: Secondary | ICD-10-CM

## 2021-07-13 NOTE — Progress Notes (Signed)
Patient ID: Steven Martin, male    DOB: [birth year], 71 y.o.   MRN: [id number]  HPI  male former smoker followed for OSA, asbestos exposure complicated by insomnia, HBP,  NPSG 06/26/94 RDI 35/hr PFT 07/31/2018- WNL PFT 07/12/20- WNL =========================================================  (06/05/2018- Dr. Maple Hudson, I as an employee of [employer] have been exposed to Asbestos related products while working there over the past 48 years. As part of a Class Action Lawsuit against the companies who manufactured those products and also my employer who exposed me to them, I have been issued a payment instructions card by my employer to cover payment for certain tests I must have conducted now and in the future.  I am required as part of the agreement of this lawsuit decision, to have certain pulmonary tests conducted yearly. They are a Pulmonary Functions Test and a continuous non-contrast routine spiral CT of my chest.  As my pulmonary physician, I am asking if you and the [facility] will conduct these yearly test on my lungs. I have attached a picture copy of the payment instructions card issued to me for your consideration to use for your practice's reimbursement for these tests.  If this yearly testing is agreeable by you and [place], I would like to schedule these tests as soon as I can since I was instructed to have them done last November.  We can talk more about this during our "phone visit" scheduled for April 29th at 9:00 a.m. Thank you.  Attachments Image 1 ) -----  From: [name]  Sent: 06/26/2018  10:48 AM EDT  To: [facility]  Subject: Visit Follow-Up Question                         This is the name and address of the law firm that Dr. Maple Hudson requested of me to send to him during our teleconference office visit today, April 29th.   [name]    [address]    [place], [place] [address]    [phone]    Valinda Hoar  [phone]  They should receive the results of my yearly Pulmonary Functions Test and C-T scan that should be completed every other year, per Dr. [name] instructions because of the radiation exposure from the test.   Please let me know if I need to provide anything else.   ------------------------------------------------------------- 07/12/20- - 71 year old male former smoker followed for OSA, Asbestos exposure/ ILD,  complicated by insomnia, HBP, psoriasis/ Humira,  Obesity, Psoriasis, Covid infection January 2022,  CPAP auto 10-20/[employer] Download -compliance 100%, AHI/ 3.7/ hr Body weight today 342 lbs Covid vax- 2 Phizer -----Reports he is still having insomnia and stopped taking xanax and sonata.  Nocturia x 3 related to prostate. Gets right back to sleep.  Complains [employer] calls too often for supplies.  Had Covid infection January - no hosp, feels fully recovered.  Now no routine cough, no chest pain or adenopathy.  PFT 07/12/20- WNL HRCT chest 06/28/20- reviewed IMPRESSION: 1. No evidence of asbestos related pleural disease. 2. Mild patchy subpleural reticulation and ground-glass opacity in both lungs with a basilar predominance, without significant traction bronchiectasis or frank honeycombing. No convincing interval progression of these findings. Findings are categorized as probable UIP per consensus guidelines: Diagnosis of Idiopathic Pulmonary Fibrosis: An Official ATS/ERS/JRS/ALAT Clinical Practice Guideline. Am Rosezetta Schlatter Crit Care Med Vol 198, Iss 5, [phone], Oct 28 2016. 3. Suggestion  of cirrhosis. 4. Cholelithiasis. 5. Aortic Atherosclerosis (ICD10-I70.0).  07/14/21-  71 year old male former smoker followed for OSA, Asbestos exposure/ ILD,  complicated by insomnia, HBP, psoriasis/ Humira,  Obesity, Psoriasis, Cirrhosis, CAD,  Covid infection January 2022,  CPAP auto 10-20/Adapt Download -compliance 97%, AHI 6.9/ hr Body weight today 355 lbs Covid vax- 2 Phizer No  residual from Covid pneumonia 2022. CT scan report reviewed with him.  He had already seen it.  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  He will discuss the question of cirrhosis with his PCP at upcoming visit.  He does not use alcohol. Recognizes some dyspnea on exertion and attributes that primarily to his weight.  No acute events.  He expresses intention to lose weight. He is comfortable with his CPAP.  Download reviewed.  Wakes occasionally during the night but no significant problem. HRCT 07/11/21- IMPRESSION: 1. Unchanged, mild pulmonary fibrosis in a pattern with apical to basal gradient, featuring irregular peripheral interstitial opacity, interlobular septal thickening, and small areas of subpleural bronchiolectasis, for example in the lateral segment right middle lobe. Findings are categorized as probable UIP per consensus guidelines: Diagnosis of Idiopathic Pulmonary Fibrosis: An Official ATS/ERS/JRS/ALAT Clinical Practice Guideline. Am Rosezetta Schlatter Crit Care Med Vol 198, Iss 5, [phone], Oct 28 2016. 2. No specific findings of asbestos exposure (i.e. Asbestos pleural disease). 3. Coronary artery disease. 4. Cholelithiasis. 5. Coarse, nodular contour of the liver with atrophy of the left lobe and enlargement of the caudate, suggestive of cirrhosis.   ROS-see HPI    + = positive Constitutional:   No-   weight loss, night sweats, fevers, chills, fatigue, lassitude. HEENT:   No-  headaches, difficulty swallowing, tooth/dental problems, sore throat,       No- sneezing, itching, ear ache, nasal congestion, post nasal drip,  CV:  No-   chest pain, orthopnea, PND, swelling in lower extremities, anasarca, dizziness, palpitations Resp: No-   shortness of breath with exertion or at rest.              No-   productive cough,  No non-productive cough,  No- coughing up of blood.              No-   change in color of mucus.  No- wheezing.   Skin: No-   rash or lesions. GI:   GU:   MS:  No-   joint pain or swelling.   Neuro-     nothing unusual Psych:  No- change in mood or affect. No depression or anxiety.  No memory loss.  OBJ- Physical Exam General- Alert, Oriented, Affect-appropriate, Distress- none acute, + big man/obese Skin- +stasis changes lower legs Lymphadenopathy- none Head- atraumatic            Eyes- Gross vision intact, PERRLA, conjunctivae and secretions clear            Ears- Hearing, canals-normal            Nose- crusting, no-Septal dev, mucus, polyps, erosion, perforation             Throat- Mallampati II , mucosa clear , drainage- none, tonsils- atrophic Neck- flexible , trachea midline, no stridor , thyroid nl, carotid no bruit Chest - symmetrical excursion , unlabored           Heart/CV- RRR , no murmur , no gallop  , no rub, nl s1 s2                           -  JVD- none , edema- none, stasis changes- none, varices- none           Lung- clear to P&A- I do not hear crackles, wheeze- none, cough- none , dullness-none, rub- none           Chest wall-  Abd-  Br/ Gen/ Rectal- Not done, not indicated Extrem- cyanosis- none, clubbing, none, atrophy- none, strength- nl Neuro- grossly intact to observation

## 2021-07-14 ENCOUNTER — Encounter: Payer: Self-pay | Admitting: Internal Medicine

## 2021-07-14 ENCOUNTER — Ambulatory Visit: Payer: Medicare Other | Admitting: Internal Medicine

## 2021-07-14 VITALS — BP 122/70 | HR 76 | Temp 98.2°F | Ht 75.0 in | Wt 355.0 lb

## 2021-07-14 DIAGNOSIS — G4733 Obstructive sleep apnea (adult) (pediatric): Secondary | ICD-10-CM | POA: Diagnosis not present

## 2021-07-14 DIAGNOSIS — J849 Interstitial pulmonary disease, unspecified: Secondary | ICD-10-CM

## 2021-07-14 DIAGNOSIS — Z77090 Contact with and (suspected) exposure to asbestos: Secondary | ICD-10-CM | POA: Diagnosis not present

## 2021-07-14 DIAGNOSIS — K7460 Unspecified cirrhosis of liver: Secondary | ICD-10-CM

## 2021-07-14 NOTE — Assessment & Plan Note (Signed)
Benefits from CPAP with satisfactory compliance and control Plan-continue auto 10-20.  He may be eligible for replacement next year.

## 2021-07-14 NOTE — Patient Instructions (Signed)
We are sending latest copy of your CT report to Earlene Plater and Cheree Ditto  We can continue CPAP auto 10-20 Anticipate replacing the machine next year  You can discuss the cirrhosis question with Dr Jacky Kindle.  Please call if we can help

## 2021-07-14 NOTE — Assessment & Plan Note (Signed)
Cirrhosis suggested on most recent chest CT.  He will discuss with his PCP.  He does not drink alcohol.

## 2021-07-14 NOTE — Assessment & Plan Note (Signed)
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  Pleural plaques are not seen.  Discussed UIP in general, emphasizing that the most important finding is stability.  As long as it does not get worse we do not have to do anything.  Nothing is seen to raise concern of neoplasm

## 2021-07-14 NOTE — Assessment & Plan Note (Signed)
He understands that weight loss would be good for him.

## 2022-05-09 ENCOUNTER — Telehealth: Payer: Self-pay | Admitting: Internal Medicine

## 2022-05-09 DIAGNOSIS — Z77090 Contact with and (suspected) exposure to asbestos: Secondary | ICD-10-CM

## 2022-05-09 NOTE — Telephone Encounter (Signed)
Patient would like to schedule CT scan before 07/10/2022. Patient phone number is [phone].

## 2022-05-10 NOTE — Telephone Encounter (Signed)
Called and spoke with pt to get some understanding for requesting a ct. I have viewed pt chart which does not mention anything about a ct scan prior to his upcoming OV. I have read over information that states " They should receive the results of my yearly Pulmonary Functions Test and C-T scan that should be completed every other year, per Dr. Janee Morn instructions because of the radiation exposure from the test "   The patient has stated that his ct scans per his lawsuit should be yearly. Dr.Young would you advise ? Thanks !

## 2022-05-11 NOTE — Telephone Encounter (Signed)
Called and spoke to pt. I did inform pt that Dr. Annamaria Boots gave the [facility] to place the Ct order. Ct order was placed. Nothing further needed

## 2022-06-30 ENCOUNTER — Other Ambulatory Visit: Payer: Medicare Other

## 2022-07-04 ENCOUNTER — Ambulatory Visit
Admission: RE | Admit: 2022-07-04 | Discharge: 2022-07-04 | Disposition: A | Discharge status: Home / Self Care | Payer: Worker's Compensation | Source: Ambulatory Visit | Attending: Internal Medicine | Admitting: Internal Medicine

## 2022-07-04 DIAGNOSIS — Z77090 Contact with and (suspected) exposure to asbestos: Secondary | ICD-10-CM

## 2022-07-10 ENCOUNTER — Ambulatory Visit: Payer: Medicare Other | Admitting: Internal Medicine

## 2022-07-10 ENCOUNTER — Ambulatory Visit (INDEPENDENT_AMBULATORY_CARE_PROVIDER_SITE_OTHER): Payer: Medicare Other | Admitting: Internal Medicine

## 2022-07-10 DIAGNOSIS — Z77090 Contact with and (suspected) exposure to asbestos: Secondary | ICD-10-CM

## 2022-07-10 DIAGNOSIS — J849 Interstitial pulmonary disease, unspecified: Secondary | ICD-10-CM

## 2022-07-10 LAB — PULMONARY FUNCTION TEST
DL/VA % pred: 93 %
DL/VA: 3.69 ml/min/mmHg/L
DLCO cor % pred: 82 %
DLCO cor: 23.93 ml/min/mmHg
DLCO unc % pred: 82 %
DLCO unc: 23.93 ml/min/mmHg
FEF 25-75 Post: 3.21 L/sec
FEF 25-75 Pre: 3.11 L/sec
FEF2575-%Change-Post: 3 %
FEF2575-%Pred-Post: 114 %
FEF2575-%Pred-Pre: 110 %
FEV1-%Change-Post: 0 %
FEV1-%Pred-Post: 89 %
FEV1-%Pred-Pre: 89 %
FEV1-Post: 3.33 L
FEV1-Pre: 3.35 L
FEV1FVC-%Change-Post: 5 %
FEV1FVC-%Pred-Pre: 107 %
FEV6-%Change-Post: -4 %
FEV6-%Pred-Post: 83 %
FEV6-%Pred-Pre: 87 %
FEV6-Post: 4 L
FEV6-Pre: 4.21 L
FEV6FVC-%Change-Post: 0 %
FEV6FVC-%Pred-Post: 105 %
FEV6FVC-%Pred-Pre: 104 %
FVC-%Change-Post: -5 %
FVC-%Pred-Post: 78 %
FVC-%Pred-Pre: 83 %
FVC-Post: 4.01 L
FVC-Pre: 4.24 L
Post FEV1/FVC ratio: 83 %
Post FEV6/FVC ratio: 100 %
Pre FEV1/FVC ratio: 79 %
Pre FEV6/FVC Ratio: 99 %
RV % pred: 73 %
RV: 1.98 L
TLC % pred: 85 %
TLC: 6.66 L

## 2022-07-10 NOTE — Progress Notes (Signed)
Full PFT performed today. °

## 2022-07-10 NOTE — Patient Instructions (Signed)
Full PFT performed today. °

## 2022-07-26 NOTE — Progress Notes (Signed)
Patient ID: PRATHAM BEISSEL, male    DOB: 11/24/1950, 72 y.o.   MRN: [id number]  HPI  male former smoker followed for OSA, asbestos exposure complicated by insomnia, HBP,  NPSG 06/26/94 RDI 35/hr PFT 07/31/2018- WNL PFT 07/12/20- WNL =========================================================  (06/05/2018- Dr. Maple Hudson, I as an employee of [employer] have been exposed to Asbestos related products while working there over the past 48 years. As part of a Class Action Lawsuit against the companies who manufactured those products and also my employer who exposed me to them, I have been issued a payment instructions card by my employer to cover payment for certain tests I must have conducted now and in the future.  I am required as part of the agreement of this lawsuit decision, to have certain pulmonary tests conducted yearly. They are a Pulmonary Functions Test and a continuous non-contrast routine spiral CT of my chest.  As my pulmonary physician, I am asking if you and the [facility] will conduct these yearly test on my lungs. I have attached a picture copy of the payment instructions card issued to me for your consideration to use for your practice's reimbursement for these tests.  If this yearly testing is agreeable by you and [facility], I would like to schedule these tests as soon as I can since I was instructed to have them done last November.  We can talk more about this during our "phone visit" scheduled for April 29th at 9:00 a.m. Thank you.  Attachments Image 1 ) -----  From: [name]  Sent: 06/26/2018  10:48 AM EDT  To: [facility]  Subject: Visit Follow-Up Question                         This is the name and address of the law firm that Dr. Maple Hudson requested of me to send to him during our teleconference office visit today, April 29th.   [name]    [address]    [place], [place] [address]    [phone]    Valinda Hoar  [phone]  They should receive the results of my yearly Pulmonary Functions Test and C-T scan that should be completed every other year, per Dr. [name] instructions because of the radiation exposure from the test.   Please let me know if I need to provide anything else.   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 CT scan report reviewed with him.  He had already seen it.  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  He will discuss the question of cirrhosis with his PCP at upcoming visit.  He does not use alcohol. Recognizes some dyspnea on exertion and attributes that primarily to his weight.  No acute events.  He expresses intention to lose weight. He is comfortable with his CPAP.  Download reviewed.  Wakes occasionally during the night but no significant problem. HRCT 07/11/21- IMPRESSION: 1. Unchanged, mild pulmonary fibrosis in a pattern with apical to basal gradient, featuring irregular peripheral interstitial opacity, interlobular septal thickening, and small areas of subpleural bronchiolectasis, for example in the lateral segment right middle  lobe. Findings are categorized as probable UIP per consensus guidelines: Diagnosis of Idiopathic Pulmonary Fibrosis: An Official ATS/ERS/JRS/ALAT Clinical Practice Guideline. Am Rosezetta Schlatter Crit Care Med Vol 198, Iss 5, [phone], Oct 28 2016. 2. No specific findings of asbestos exposure (i.e. Asbestos pleural disease). 3. Coronary artery disease. 4. Cholelithiasis. 5. Coarse, nodular contour of the liver with atrophy of the left lobe and enlargement of the caudate,  suggestive of cirrhosis.  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 Covid infection January 2022,  CPAP auto 10-20/[employer]   05/2017 Download -compliance 97%, AHI 3.4/ hr Body weight today 341 lbs -----Patient would like new cpap machine states current one is at least 71 years old  Download reviewed.  He remains very compliant with CPAP and reports being comfortable using it with no concerns.  Old machine is getting "noisy" and he asks about replacement. He denies interval changes in his breathing or other acute health issues.   We reviewed his PFT and chest CT results.  We agreed that we could check both of these every other year.  His ILD has been stable.  Without pleural plaques we cannot specifically identify it as related to asbestos but surveillance will continue. PFT 07/10/22-WNL HRCT chest 07/04/22- IMPRESSION: 1. Mild basilar predominant subpleural reticulation and ground-glass, similar to 07/11/2021. Findings may be due to nonspecific interstitial pneumonitis or usual interstitial pneumonitis. Findings are indeterminate for UIP per consensus guidelines: Diagnosis of Idiopathic Pulmonary Fibrosis: An Official ATS/ERS/JRS/ALAT Clinical Practice Guideline. Am Rosezetta Schlatter Crit Care Med Vol 198, Iss 5, [phone], Oct 28 2016. 2. Cirrhosis. 3. Cholelithiasis. 4. Coronary artery calcification. 5.  Emphysema (ICD10-J43.9).    ROS-see HPI    + = positive Constitutional:   No-   weight loss, night sweats, fevers, chills, fatigue, lassitude. HEENT:   No-  headaches, difficulty swallowing, tooth/dental problems, sore throat,       No- sneezing, itching, ear ache, nasal congestion, post nasal drip,  CV:  No-   chest pain, orthopnea, PND, swelling in lower extremities, anasarca,  dizziness, palpitations Resp: No-   shortness of breath with exertion or at rest.              No-   productive cough,  No non-productive cough,  No- coughing up of blood.              No-   change in color of mucus.  No- wheezing.   Skin: No-   rash or lesions. GI:   GU:  MS:  No-   joint pain or swelling.   Neuro-     nothing unusual Psych:  No- change in mood or affect. No depression or anxiety.  No memory loss.  OBJ- Physical Exam General- Alert, Oriented, Affect-appropriate, Distress- none acute, + big man/obese Skin- +stasis changes lower legs Lymphadenopathy- none Head- atraumatic            Eyes- Gross vision intact, PERRLA, conjunctivae and secretions clear            Ears- Hearing, canals-normal            Nose- crusting, no-Septal dev, mucus, polyps, erosion, perforation             Throat- Mallampati II , mucosa clear , drainage- none, tonsils- atrophic Neck- flexible , trachea midline, no stridor ,  thyroid nl, carotid no bruit Chest - symmetrical excursion , unlabored           Heart/CV- RRR , no murmur , no gallop  , no rub, nl s1 s2                           - JVD- none , edema- none, stasis changes- none, varices- none           Lung- clear to P&A- I do not hear crackles, wheeze- none, cough- none , dullness-none, rub- none           Chest wall-  Abd-  Br/ Gen/ Rectal- Not done, not indicated Extrem- cyanosis- none, clubbing, none, atrophy- none, strength- nl Neuro- grossly intact to observation

## 2022-07-27 ENCOUNTER — Encounter: Payer: Self-pay | Admitting: Internal Medicine

## 2022-07-27 ENCOUNTER — Ambulatory Visit: Payer: Medicare Other | Admitting: Internal Medicine

## 2022-07-27 VITALS — BP 130/68 | HR 84 | Ht 75.0 in | Wt 341.2 lb

## 2022-07-27 DIAGNOSIS — Z77090 Contact with and (suspected) exposure to asbestos: Secondary | ICD-10-CM

## 2022-07-27 DIAGNOSIS — G4733 Obstructive sleep apnea (adult) (pediatric): Secondary | ICD-10-CM | POA: Diagnosis not present

## 2022-07-27 NOTE — Assessment & Plan Note (Signed)
He remains stable with known mild ILD but nonspecific in the absence of Asbestos plaques. Plan-we will check PFT and HRCT chest again in 2 years-2026

## 2022-07-27 NOTE — Assessment & Plan Note (Signed)
Benefits from CPAP with good compliance and control Plan-continue auto 10-20.  Replace old machine 

## 2022-07-27 NOTE — Patient Instructions (Addendum)
We will plan on checking your PFT and CT scan every other year- so 2026. We will set those up when we see you next year.  Order- DME [employer]- please replace old CPAP machine auto 10-20, mask of choice, humidifier, supplies, AirView/ card  Please call if we can help

## 2023-01-22 ENCOUNTER — Ambulatory Visit (INDEPENDENT_AMBULATORY_CARE_PROVIDER_SITE_OTHER): Payer: Medicare Other

## 2023-01-22 ENCOUNTER — Ambulatory Visit: Payer: Medicare Other | Admitting: Podiatry

## 2023-01-22 ENCOUNTER — Encounter: Payer: Self-pay | Admitting: Podiatry

## 2023-01-22 ENCOUNTER — Other Ambulatory Visit: Payer: Self-pay | Admitting: Podiatry

## 2023-01-22 VITALS — Ht 75.0 in | Wt 341.2 lb

## 2023-01-22 DIAGNOSIS — M779 Enthesopathy, unspecified: Secondary | ICD-10-CM | POA: Diagnosis not present

## 2023-01-22 DIAGNOSIS — L97522 Non-pressure chronic ulcer of other part of left foot with fat layer exposed: Secondary | ICD-10-CM

## 2023-01-22 MED ORDER — DOXYCYCLINE HYCLATE 100 MG PO TABS: 100.0000 mg | ORAL_TABLET | Freq: Two times a day (BID) | ORAL | 0 refills | Status: DC

## 2023-01-22 NOTE — Progress Notes (Signed)
Chief Complaint  Patient presents with   Foot Ulcer    Pt is here due to ulcer on left greater toe, pt states the spot has been there for a month, was a callous there before ulcer had it shave down while getting a pedicure and thinks it was thinned out to much where the spot began, states it does not hurt just wont heal.    Subjective:  72 y.o. male with PMHx left ankle trauma presenting today for evaluation of an ulcer that developed about 1 month ago to the left great toe.  He has been applying Neosporin and a Band-Aid.  He says that due to his ankle trauma and surgeries he has dropped to the left great toe and applied significant pressure when walking.   Past Medical History:  Diagnosis Date   Allergy    Depression    Hypertension    OSA (obstructive sleep apnea)    NPSG 06-26-94; RDI 35/hr   Osteoarthritis    Psoriasis    Sleep apnea    wears cpap   Traumatic closed displaced fracture of distal end of tibia with fibula, right, initial encounter 03/09/2018    Past Surgical History:  Procedure Laterality Date   COLONOSCOPY     NASAL TURBINATE REDUCTION  1973   ORIF TIBIA FRACTURE Left 03/10/2018   Procedure: OPEN REDUCTION INTERNAL FIXATION (ORIF) TIBIA/FIBULA FRACTURE;  Surgeon: Terance Hart, MD;  Location: [facility] OR;  Service: Orthopedics;  Laterality: Left;   REPLACEMENT TOTAL KNEE  2009,2010   x 2   ROTATOR CUFF REPAIR  2009   left    No Known Allergies   LT great toe 01/22/2023   Objective/Physical Exam General: The patient is alert and oriented x3 in no acute distress.  Dermatology:  Wound #1 noted to the plantar medial aspect of the left great toe measuring approximately 1.5 x 0.8 x 0.6 cm (LxWxD).  The wound does not probe to bone  To the noted ulceration(s), there is no eschar. There is a moderate amount of slough, fibrin, and necrotic tissue noted. Granulation tissue and wound base is red. There is a minimal amount of serosanguineous drainage noted.  There is no exposed bone muscle-tendon ligament or joint. There is no malodor. Periwound integrity is intact. Skin is warm, dry and supple bilateral lower extremities.  Vascular: Palpable pedal pulses bilaterally. No edema or erythema noted. Capillary refill within normal limits.  Neurological: Light touch and protective threshold diminished bilaterally.   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  Patient is ambulatory.  Pes planovalgus deformity with excessive pressure to the great toe noted during ambulation  Assessment: 1.  Ulcer left plantar great toe   Plan of Care:  -Patient was evaluated. -Medically necessary excisional debridement including subcutaneous tissue was performed using a tissue nipper and a chisel blade. Excisional debridement of all the necrotic nonviable tissue down to healthy bleeding viable tissue was performed with post-debridement measurements same as pre-. -The wound was cleansed and dry sterile dressing applied. -Cultures taken and sent to pathology for culture and sensitivity -Prescription for doxycycline 100 mg 2 times daily #20 -Postsurgical shoe dispensed.  WBAT -Return to clinic 2 weeks   Felecia Shelling, DPM [facility]  Dr. Felecia Shelling, DPM    [address]  [place], [place] [address]                Office [phone]  Fax [phone]

## 2023-01-25 LAB — WOUND CULTURE
MICRO NUMBER:: 15775997
SPECIMEN QUALITY:: ADEQUATE

## 2023-01-25 LAB — HOUSE ACCOUNT TRACKING

## 2023-01-29 ENCOUNTER — Other Ambulatory Visit: Payer: Self-pay | Admitting: Podiatry

## 2023-01-29 MED ORDER — CIPROFLOXACIN HCL 500 MG PO TABS
500.0000 mg | ORAL_TABLET | Freq: Two times a day (BID) | ORAL | 0 refills | Status: AC
Start: 2023-01-29 — End: 2023-02-12

## 2023-01-29 NOTE — Progress Notes (Signed)
Patient.  No answer.  Left voicemail to patient that prescription for Cipro 500 mg BID x 14 days was sent to the pharmacy.  Begin taking and I will see him at his next scheduled appointment on 02/05/2023.

## 2023-01-29 NOTE — Progress Notes (Signed)
Rx Cipro 500mg  BID x 14 days based on culture results. MRSA+  Felecia Shelling, DPM [facility]  Dr. Felecia Shelling, DPM    [address] [place], [place] [address]                Office [phone]  Fax [phone]

## 2023-01-30 ENCOUNTER — Telehealth: Payer: Self-pay

## 2023-01-30 ENCOUNTER — Other Ambulatory Visit: Payer: Self-pay

## 2023-01-30 MED ORDER — DOXYCYCLINE HYCLATE 100 MG PO TABS: 100.0000 mg | ORAL_TABLET | Freq: Two times a day (BID) | ORAL | 0 refills | Status: DC

## 2023-01-30 NOTE — Telephone Encounter (Signed)
Spoke to patient at length - he has concerns about starting the cipro - The first interaction is with diuretics, which he takes as part of his blood pressure regimen. He is also currently taking 60 mg of methotrexate once weekly - that is not listed in his chart. He also read about possible tendon damage. Advised not to start the cipro until he hears back from either Dr. Logan Bores or myself. Patient aware that Dr. Logan Bores is not in the office today Please advise

## 2023-02-05 ENCOUNTER — Ambulatory Visit: Payer: Medicare Other | Admitting: Podiatry

## 2023-02-05 ENCOUNTER — Encounter: Payer: Self-pay | Admitting: Podiatry

## 2023-02-05 DIAGNOSIS — L97522 Non-pressure chronic ulcer of other part of left foot with fat layer exposed: Secondary | ICD-10-CM

## 2023-02-05 MED ORDER — MUPIROCIN 2 % EX OINT: 1.0000 | TOPICAL_OINTMENT | Freq: Two times a day (BID) | CUTANEOUS | 1 refills | Status: AC

## 2023-02-05 NOTE — Progress Notes (Signed)
Chief Complaint  Patient presents with   Routine Post Op    PATIENT IS HERE FOR A FOLLOW UP FOR THE WOUND AT THE BOTTOM OF HIS LEFT FOOT HALLUX , PATIENT STATES HE IS NOT IN ANY PAIN.    Subjective:  72 y.o. male with PMHx left ankle trauma presenting today for follow-up evaluation of an ulcer to the left great toe.  He has been wearing the surgical shoe as instructed.  No new complaints   Past Medical History:  Diagnosis Date   Allergy    Depression    Hypertension    OSA (obstructive sleep apnea)    NPSG 06-26-94; RDI 35/hr   Osteoarthritis    Psoriasis    Sleep apnea    wears cpap   Traumatic closed displaced fracture of distal end of tibia with fibula, right, initial encounter 03/09/2018    Past Surgical History:  Procedure Laterality Date   COLONOSCOPY     NASAL TURBINATE REDUCTION  1973   ORIF TIBIA FRACTURE Left 03/10/2018   Procedure: OPEN REDUCTION INTERNAL FIXATION (ORIF) TIBIA/FIBULA FRACTURE;  Surgeon: Terance Hart, MD;  Location: [facility] OR;  Service: Orthopedics;  Laterality: Left;   REPLACEMENT TOTAL KNEE  2009,2010   x 2   ROTATOR CUFF REPAIR  2009   left    No Known Allergies   LT great toe 01/22/2023   Objective/Physical Exam General: The patient is alert and oriented x3 in no acute distress.  Dermatology:  Stable.  Slight improvement.  Wound #1 noted to the plantar medial aspect of the left great toe measuring approximately 1.2 x 0.7 x 0.3 cm (LxWxD).  The wound does not probe to bone  To the noted ulceration(s), there is no eschar. There is a moderate amount of slough, fibrin, and necrotic tissue noted. Granulation tissue and wound base is red. There is a minimal amount of serosanguineous drainage noted. There is no exposed bone muscle-tendon ligament or joint. There is no malodor. Periwound integrity is intact. Skin is warm, dry and supple bilateral lower extremities.  Vascular: Palpable pedal pulses bilaterally. No edema or erythema noted.  Capillary refill within normal limits.  Neurological: Light touch and protective threshold diminished bilaterally.   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  Patient is ambulatory.  Pes planovalgus deformity with excessive pressure to the great toe noted during ambulation  Assessment: 1.  Ulcer left plantar great toe 2.  MRSA+   Plan of Care:  -Patient was evaluated. -Medically necessary excisional debridement including subcutaneous tissue was performed using a tissue nipper and a chisel blade. Excisional debridement of all the necrotic nonviable tissue down to healthy bleeding viable tissue was performed with post-debridement measurements same as pre-. -The wound was cleansed and dry sterile dressing applied. - Continue doxycycline 100 mg 2 times daily until completed.  Patient will have been on this antibiotic for 3 weeks now -Prescription for mupirocin 2% ointment to apply to the wound daily - Continue WBAT surgical shoe.  Offloading felt padding was applied to the insole of the shoe to offload pressure from the great toe -Return to clinic 3 weeks   Felecia Shelling, DPM [facility]  Dr. Felecia Shelling, DPM    [address]  [place], [place] [address]                Office [phone]  Fax [phone]

## 2023-02-16 ENCOUNTER — Telehealth: Payer: Self-pay | Admitting: Internal Medicine

## 2023-02-16 NOTE — Telephone Encounter (Signed)
[facility] needs a copy of sleep study results, cpap prescription and chart notes that show why he uses a cpap. Fax number [phone]

## 2023-02-19 ENCOUNTER — Other Ambulatory Visit: Payer: Self-pay

## 2023-02-19 DIAGNOSIS — G4733 Obstructive sleep apnea (adult) (pediatric): Secondary | ICD-10-CM

## 2023-02-22 NOTE — Telephone Encounter (Signed)
I have faxed the last note from 07/27/22, sleep study from 1996, and referral from 5/30/224 to [facility]

## 2023-02-27 ENCOUNTER — Telehealth: Payer: Self-pay | Admitting: Internal Medicine

## 2023-02-27 NOTE — Telephone Encounter (Signed)
Patient received a text from [facility] that said he needed a sleep study done. I see in the system that a home sleep study has been requested but it is closed. Does the patient still need have a hst completed? Please call and advise [phone]

## 2023-03-12 ENCOUNTER — Ambulatory Visit: Payer: Medicare Other | Admitting: Podiatry

## 2023-03-12 ENCOUNTER — Encounter: Payer: Self-pay | Admitting: Podiatry

## 2023-03-12 DIAGNOSIS — L97522 Non-pressure chronic ulcer of other part of left foot with fat layer exposed: Secondary | ICD-10-CM

## 2023-03-12 DIAGNOSIS — M2012 Hallux valgus (acquired), left foot: Secondary | ICD-10-CM | POA: Diagnosis not present

## 2023-03-12 NOTE — Progress Notes (Signed)
 Chief Complaint  Patient presents with   Foot Ulcer    Follow up ulcer plantar hallux left   Its doing much better. I've still been using the ointment and bandaid. I'm able to wear a regular shoe now    Subjective:  73 y.o. male with PMHx left ankle trauma presenting today for follow-up evaluation of an ulcer to the left great toe.  He has been wearing the surgical shoe as instructed.  No new complaints   Past Medical History:  Diagnosis Date   Allergy    Depression    Hypertension    OSA (obstructive sleep apnea)    NPSG 06-26-94; RDI 35/hr   Osteoarthritis    Psoriasis    Sleep apnea    wears cpap   Traumatic closed displaced fracture of distal end of tibia with fibula, right, initial encounter 03/09/2018    Past Surgical History:  Procedure Laterality Date   COLONOSCOPY     NASAL TURBINATE REDUCTION  1973   ORIF TIBIA FRACTURE Left 03/10/2018   Procedure: OPEN REDUCTION INTERNAL FIXATION (ORIF) TIBIA/FIBULA FRACTURE;  Surgeon: Elsa Lonni SAUNDERS, MD;  Location: [facility] OR;  Service: Orthopedics;  Laterality: Left;   REPLACEMENT TOTAL KNEE  2009,2010   x 2   ROTATOR CUFF REPAIR  2009   left    No Known Allergies   LT great toe 01/22/2023   Objective/Physical Exam General: The patient is alert and oriented x3 in no acute distress.  Dermatology:  Stable.  Slight improvement.  Wound #1 noted to the plantar medial aspect of the left great toe measuring approximately 0.8 x 0.4 x 0.2 cm.  Overall the wound is improved significantly.  No drainage.  Granular wound base.  There is some periwound callus around the area overall significant improvement  Vascular: Palpable pedal pulses bilaterally. No edema or erythema noted. Capillary refill within normal limits.  Neurological: Light touch and protective threshold diminished bilaterally.   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  Patient is ambulatory.  Pes planovalgus deformity with  excessive pressure to the great toe noted during ambulation  Assessment: 1.  Ulcer left plantar great toe 2.  MRSA+ 3.  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  History of multiple surgeries left foot   Plan of Care:  -Patient was evaluated. -Medically necessary excisional debridement including subcutaneous tissue was performed using a tissue nipper and a chisel blade. Excisional debridement of all the necrotic nonviable tissue down to healthy bleeding viable tissue was performed with post-debridement measurements same as pre-. -The wound was cleansed and dry sterile dressing applied. - Patient completed the oral doxycycline  as prescribed - Continue mupirocin  2% ointment to apply to the wound daily - Continue WBAT surgical shoe.  Offloading felt padding was applied again today to the insole of the shoe to offload pressure from the great toe -Today we did discuss the possibility of surgical correction of the great toe after the wound has healed.  The patient has had callus development and pain associated to the great toe specifically to this area for a long period of time now.  Surgical correction and realignment of the toe may help to alleviate pressure from the great toe joint and prevent callus and ulcer development -Return to clinic 5 weeks.  This time patient will need x-rays left foot and ankle   Thresa EMERSON Sar, DPM [facility]  Dr. Thresa EMERSON Sar, DPM    [address] [place], [place] [address]                Office [phone]  Fax [phone]

## 2023-03-22 DIAGNOSIS — G4733 Obstructive sleep apnea (adult) (pediatric): Secondary | ICD-10-CM

## 2023-04-16 ENCOUNTER — Ambulatory Visit: Payer: Medicare Other | Admitting: Podiatry

## 2023-04-18 ENCOUNTER — Ambulatory Visit: Payer: Medicare Other | Admitting: Podiatry

## 2023-04-18 ENCOUNTER — Encounter: Payer: Self-pay | Admitting: Podiatry

## 2023-04-18 ENCOUNTER — Ambulatory Visit (INDEPENDENT_AMBULATORY_CARE_PROVIDER_SITE_OTHER): Payer: Medicare Other

## 2023-04-18 DIAGNOSIS — L97522 Non-pressure chronic ulcer of other part of left foot with fat layer exposed: Secondary | ICD-10-CM

## 2023-04-18 DIAGNOSIS — M19072 Primary osteoarthritis, left ankle and foot: Secondary | ICD-10-CM

## 2023-04-18 DIAGNOSIS — M65972 Unspecified synovitis and tenosynovitis, left ankle and foot: Secondary | ICD-10-CM | POA: Diagnosis not present

## 2023-04-18 NOTE — Progress Notes (Signed)
No chief complaint on file.   Subjective:  73 y.o. male with PMHx left ankle trauma presenting today for follow-up evaluation of an ulcer to the left great toe.  Overall significant improvement.  He has been wearing the offloading pads that were dispensed last visit. He has been applying the mupirocin ointment as instructed.   Past Medical History:  Diagnosis Date   Allergy    Depression    Hypertension    OSA (obstructive sleep apnea)    NPSG 06-26-94; RDI 35/hr   Osteoarthritis    Psoriasis    Sleep apnea    wears cpap   Traumatic closed displaced fracture of distal end of tibia with fibula, right, initial encounter 03/09/2018    Past Surgical History:  Procedure Laterality Date   COLONOSCOPY     NASAL TURBINATE REDUCTION  1973   ORIF TIBIA FRACTURE Left 03/10/2018   Procedure: OPEN REDUCTION INTERNAL FIXATION (ORIF) TIBIA/FIBULA FRACTURE;  Surgeon: Terance Hart, MD;  Location: [facility] OR;  Service: Orthopedics;  Laterality: Left;   REPLACEMENT TOTAL KNEE  2009,2010   x 2   ROTATOR CUFF REPAIR  2009   left    No Known Allergies   LT great toe 01/22/2023   LT great toe 04/18/2023   Objective/Physical Exam General: The patient is alert and oriented x3 in no acute distress.  Dermatology:  Stable.  Significant improvement.  Wound #1 noted to the plantar medial aspect of the left great toe measuring approximately 0.6 from 0.2-0.1 cm.  Overall the wound is improved significantly.  No drainage.  Granular wound base.  There is some periwound callus around the area overall significant improvement  Vascular: Palpable pedal pulses bilaterally. No edema or erythema noted. Capillary refill within normal limits.  Neurological: Light touch and protective threshold diminished bilaterally.   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  Patient is ambulatory.  Pes planovalgus deformity with excessive pressure to the great toe noted during  ambulation  Radiographic exam LT foot and ankle 04/18/2023: Orthopedic hardware throughout the foot and ankle appears to be intact.  No complications from hardware noted.  No osseous erosions or concern for osteomyelitis.  Diffuse degenerative changes noted throughout the joints of the foot  Assessment: 1.  Ulcer left plantar great toe 2.  MRSA+ 3.  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  History of multiple surgeries left foot   Plan of Care:  -Patient was evaluated. -Medically necessary excisional debridement including subcutaneous tissue was performed using a tissue nipper and a chisel blade. Excisional debridement of all the necrotic nonviable tissue down to healthy bleeding viable tissue was performed with post-debridement measurements same as pre-. -The wound was cleansed and dry sterile dressing applied. - Continue mupirocin 2% ointment to apply to the wound daily - Patient has now transition back into tennis shoes with offloading felt pads which seems to work well for him.  For now we will continue to pursue conservative treatment -Return to clinic 4 weeks  Felecia Shelling, DPM [facility]  Dr. Felecia Shelling, DPM    [address] [place] [address]                Office [phone]  Fax [phone]

## 2023-05-21 ENCOUNTER — Ambulatory Visit: Payer: Medicare Other | Admitting: Podiatry

## 2023-05-30 ENCOUNTER — Ambulatory Visit: Admitting: Podiatry

## 2023-05-30 ENCOUNTER — Encounter: Payer: Self-pay | Admitting: Podiatry

## 2023-05-30 DIAGNOSIS — L97522 Non-pressure chronic ulcer of other part of left foot with fat layer exposed: Secondary | ICD-10-CM | POA: Diagnosis not present

## 2023-05-30 DIAGNOSIS — M2142 Flat foot [pes planus] (acquired), left foot: Secondary | ICD-10-CM

## 2023-05-30 NOTE — Progress Notes (Signed)
 Chief Complaint  Patient presents with   Wound Check    Ulcer left great toe follow up healing well no issues or pain    Subjective:  73 y.o. male with PMHx left ankle trauma presenting today for follow-up evaluation of an ulcer to the left great toe.  Overall significant improvement.  He has been wearing the offloading pads that were dispensed last visit. He has been applying the mupirocin ointment as instructed.   Past Medical History:  Diagnosis Date   Allergy    Depression    Hypertension    OSA (obstructive sleep apnea)    NPSG 06-26-94; RDI 35/hr   Osteoarthritis    Psoriasis    Sleep apnea    wears cpap   Traumatic closed displaced fracture of distal end of tibia with fibula, right, initial encounter 03/09/2018    Past Surgical History:  Procedure Laterality Date   COLONOSCOPY     NASAL TURBINATE REDUCTION  1973   ORIF TIBIA FRACTURE Left 03/10/2018   Procedure: OPEN REDUCTION INTERNAL FIXATION (ORIF) TIBIA/FIBULA FRACTURE;  Surgeon: Terance Hart, MD;  Location: [facility] OR;  Service: Orthopedics;  Laterality: Left;   REPLACEMENT TOTAL KNEE  2009,2010   x 2   ROTATOR CUFF REPAIR  2009   left    No Known Allergies   LT great toe 01/22/2023   LT great toe 04/18/2023   Objective/Physical Exam General: The patient is alert and oriented x3 in no acute distress.  Dermatology:  Continued significant improvement.  Wound #1 noted to the plantar medial aspect of the left great toe measuring approximately 0.2 x 0.1 x 0.1 cm.  Overall the wound is improved significantly.  No drainage.  Granular wound base.  There is some periwound callus around the area overall significant improvement  Vascular: Palpable pedal pulses bilaterally. No edema or erythema noted. Capillary refill within normal limits.  Neurological: Light touch and protective threshold diminished bilaterally.   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  Patient is  ambulatory.  Pes planovalgus deformity with excessive pressure to the great toe noted during ambulation  Radiographic exam LT foot and ankle 04/18/2023: Orthopedic hardware throughout the foot and ankle appears to be intact.  No complications from hardware noted.  No osseous erosions or concern for osteomyelitis.  Diffuse degenerative changes noted throughout the joints of the foot  Assessment: 1.  Ulcer left plantar great toe 2.  MRSA+ 3.  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  History of multiple surgeries left foot   Plan of Care:  -Patient was evaluated. -Medically necessary excisional debridement including subcutaneous tissue was performed using a tissue nipper and a chisel blade. Excisional debridement of all the necrotic nonviable tissue down to healthy bleeding viable tissue was performed with post-debridement measurements same as pre-. -The wound was cleansed and dry sterile dressing applied. - Continue mupirocin 2% ointment to apply to the wound daily - Continue tennis shoes with offloading felt dancers pads which seem to be helping significantly.  The wound is 90% healed.  It only measures approximately 0.2 and 0.1 x 0.1 cm. -Return to clinic 3 months or sooner if needed  Felecia Shelling, DPM [facility]  Dr. Felecia Shelling, DPM    [address]  [place], [place] [address]                Office [phone]  Fax [phone]

## 2023-07-26 NOTE — Progress Notes (Signed)
 Patient ID: Steven Martin, male    DOB: [birth year], 73 y.o.   MRN: [id number]  HPI  male former smoker followed for OSA, Asbestos exposure complicated by insomnia, HBP,  NPSG 06/26/94 RDI 35/hr PFT 07/31/2018- WNL PFT 07/12/20- WNL HST 03/22/23- AHI 8.8/hr, desat to 88%, body weight 330 lbs =========================================================   07/27/22- 73 year old male former smoker followed for OSA, Asbestos exposure/ ILD,  complicated by insomnia, HBP, psoriasis/ Humira,  Obesity, Psoriasis, Cirrhosis, CAD,  [employer] provides for long-term surveillance of PFT and chest CT. Covid infection January 2022,  CPAP auto 10-20/[employer]   05/2017 Download -compliance 97%, AHI 3.4/ hr Body weight today 341 lbs -----Patient would like new cpap machine states current one is at least 72 years old  Download reviewed.  He remains very compliant with CPAP and reports being comfortable using it with no concerns.  Old machine is getting noisy and he asks about replacement. He denies interval changes in his breathing or other acute health issues.   We reviewed his PFT and chest CT results.  We agreed that we could check both of these every other year.  His ILD has been stable.  Without pleural plaques we cannot specifically identify it as related to asbestos but surveillance will continue. PFT 07/10/22-WNL HRCT chest 07/04/22- IMPRESSION: 1. Mild basilar predominant subpleural reticulation and ground-glass, similar to 07/11/2021. Findings may be due to nonspecific interstitial pneumonitis or usual interstitial pneumonitis. Findings are indeterminate for UIP per consensus guidelines: Diagnosis of Idiopathic Pulmonary Fibrosis: An Official ATS/ERS/JRS/ALAT Clinical Practice Guideline. Am JINNY [employer] Crit Care Med Vol 198, Iss 5, [phone], Oct 28 2016. 2. Cirrhosis. 3. Cholelithiasis. 4. Coronary artery calcification. 5.  Emphysema (ICD10-J43.9).  07/27/23 73 year old male former  smoker followed for OSA, Asbestos exposure/ ILD,  complicated by insomnia, HBP, psoriasis/ Humira,  Obesity, Psoriasis, Cirrhosis, CAD,  [employer] provides for long-term surveillance of PFT and chest CT. Covid infection January 2022,  CPAP auto 10-20/[employer]   June, 2024 HST 03/22/23- AHI 8.8/hr, desat to 88%, body weight 330 lbs Download -compliance 93%, AHI 1.9/hr Body weight today 346 lbs Discussed the use of AI scribe software for clinical note transcription with the patient, who gave verbal consent to proceed.  History of Present Illness   Steven Martin is a 73 year old male who presents with difficulty sleeping and waking up frequently during the night.  He uses a CPAP machine regularly but continues to experience frequent awakenings after two hours of sleep. Limiting fluid intake after 7 PM reduces nocturnal urination, but he still wakes one or two times a night to urinate. Previous use of sleep aids such as alprazolam  and clonazepam  was unsuccessful. Daytime activities remain largely unaffected, though he may doze off briefly during the day.  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     Assessment and Plan:    Sleep apnea Sleep apnea well-managed with CPAP. Effective control with less than two apneas per hour. - Continue current CPAP settings.  Insomnia Chronic insomnia with difficulty maintaining sleep. Trazodone  proposed as new treatment. - Prescribe trazodone , 1-2 tablets at bedtime, start with one tablet. - Advise taking trazodone  15-20 minutes before bedtime.  Nocturia Nocturia with 1-2 episodes per night, worsened by evening fluid intake. See Urology per PCP, if needed.  Asbestos exposure Annual CT and biennial pulmonary function tests recommended.  Monitoring for fibrosis progression. - Order annual CT scan. - Order pulmonary  function test.     ROS-see HPI    + = positive Constitutional:   No-   weight loss, night sweats, fevers, chills, fatigue, lassitude. HEENT:   No-  headaches, difficulty swallowing, tooth/dental problems, sore throat,       No- sneezing, itching, ear ache, nasal congestion, post nasal drip,  CV:  No-   chest pain, orthopnea, PND, swelling in lower extremities, anasarca, dizziness, palpitations Resp: No-   shortness of breath with exertion or at rest.              No-   productive cough,  No non-productive cough,  No- coughing up of blood.              No-   change in color of mucus.  No- wheezing.   Skin: No-   rash or lesions. GI:   GU:  MS:  No-   joint pain or swelling.   Neuro-     nothing unusual Psych:  No- change in mood or affect. No depression or anxiety.  No memory loss.  OBJ- Physical Exam General- Alert, Oriented, Affect-appropriate, Distress- none acute, + big man/obese Skin- +stasis changes lower legs Lymphadenopathy- none Head- atraumatic            Eyes- Gross vision intact, PERRLA, conjunctivae and secretions clear            Ears- Hearing, canals-normal            Nose- crusting, no-Septal dev, mucus, polyps, erosion, perforation             Throat- Mallampati II , mucosa clear , drainage- none, tonsils- atrophic Neck- flexible , trachea midline, no stridor , thyroid nl, carotid no bruit Chest - symmetrical excursion , unlabored           Heart/CV- RRR , no murmur , no gallop  , no rub, nl s1 s2                           - JVD- none , edema- none, stasis changes- none, varices- none           Lung- clear to P&A- I do not hear crackles, wheeze- none, cough- none , dullness-none, rub- none           Chest wall-  Abd-  Br/ Gen/ Rectal- Not done, not indicated Extrem- cyanosis- none, clubbing, none, atrophy- none, strength- nl Neuro- grossly intact to observation

## 2023-07-27 ENCOUNTER — Encounter: Payer: Self-pay | Admitting: Internal Medicine

## 2023-07-27 ENCOUNTER — Ambulatory Visit: Payer: Medicare Other | Admitting: Internal Medicine

## 2023-07-27 VITALS — BP 112/70 | HR 82 | Ht 75.0 in | Wt 346.4 lb

## 2023-07-27 DIAGNOSIS — Z77090 Contact with and (suspected) exposure to asbestos: Secondary | ICD-10-CM

## 2023-07-27 DIAGNOSIS — J849 Interstitial pulmonary disease, unspecified: Secondary | ICD-10-CM

## 2023-07-27 MED ORDER — TRAZODONE HCL 100 MG PO TABS: ORAL_TABLET | ORAL | 2 refills | Status: DC

## 2023-07-27 NOTE — Patient Instructions (Signed)
 Order- schedule HRCT chest   dx Asbestos exposure, ILD  ([employer])  Order- schedule PFT   dx Asbestos exposure, ILD  ([employer])  You are doing well with CPAP. We will continue autopap 10-20.

## 2023-07-31 ENCOUNTER — Ambulatory Visit: Payer: Self-pay

## 2023-07-31 DIAGNOSIS — J849 Interstitial pulmonary disease, unspecified: Secondary | ICD-10-CM

## 2023-07-31 DIAGNOSIS — Z77090 Contact with and (suspected) exposure to asbestos: Secondary | ICD-10-CM

## 2023-07-31 LAB — PULMONARY FUNCTION TEST
DL/VA % pred: 87 %
DL/VA: 3.46 ml/min/mmHg/L
DLCO unc % pred: 70 %
DLCO unc: 20.34 ml/min/mmHg
FEF 25-75 Post: 2.66 L/s
FEF 25-75 Pre: 3.22 L/s
FEF2575-%Change-Post: -17 %
FEF2575-%Pred-Post: 96 %
FEF2575-%Pred-Pre: 116 %
FEV1-%Change-Post: -3 %
FEV1-%Pred-Post: 83 %
FEV1-%Pred-Pre: 86 %
FEV1-Post: 3.11 L
FEV1-Pre: 3.22 L
FEV1FVC-%Change-Post: 0 %
FEV1FVC-%Pred-Pre: 109 %
FEV6-%Change-Post: -3 %
FEV6-%Pred-Post: 81 %
FEV6-%Pred-Pre: 84 %
FEV6-Post: 3.88 L
FEV6-Pre: 4.01 L
FEV6FVC-%Change-Post: 0 %
FEV6FVC-%Pred-Post: 104 %
FEV6FVC-%Pred-Pre: 105 %
FVC-%Change-Post: -2 %
FVC-%Pred-Post: 77 %
FVC-%Pred-Pre: 79 %
FVC-Post: 3.92 L
FVC-Pre: 4.01 L
Post FEV1/FVC ratio: 79 %
Post FEV6/FVC ratio: 99 %
Pre FEV1/FVC ratio: 80 %
Pre FEV6/FVC Ratio: 100 %
RV % pred: 103 %
RV: 2.81 L
TLC % pred: 88 %
TLC: 6.94 L

## 2023-07-31 NOTE — Patient Instructions (Signed)
 Full pft performed today.

## 2023-07-31 NOTE — Progress Notes (Signed)
 Full pft performed today.

## 2023-08-06 ENCOUNTER — Ambulatory Visit
Admission: RE | Admit: 2023-08-06 | Discharge: 2023-08-06 | Disposition: A | Discharge status: Home / Self Care | Payer: Worker's Compensation | Source: Ambulatory Visit | Attending: Internal Medicine | Admitting: Internal Medicine

## 2023-08-06 ENCOUNTER — Other Ambulatory Visit: Payer: Self-pay | Admitting: Internal Medicine

## 2023-08-06 DIAGNOSIS — Z77090 Contact with and (suspected) exposure to asbestos: Secondary | ICD-10-CM

## 2023-08-06 DIAGNOSIS — J849 Interstitial pulmonary disease, unspecified: Secondary | ICD-10-CM

## 2023-08-28 ENCOUNTER — Ambulatory Visit: Payer: Self-pay | Admitting: Internal Medicine

## 2023-08-29 ENCOUNTER — Ambulatory Visit: Admitting: Podiatry

## 2023-08-29 ENCOUNTER — Encounter: Payer: Self-pay | Admitting: Podiatry

## 2023-08-29 VITALS — Ht 75.0 in | Wt 346.4 lb

## 2023-08-29 DIAGNOSIS — L97522 Non-pressure chronic ulcer of other part of left foot with fat layer exposed: Secondary | ICD-10-CM | POA: Diagnosis not present

## 2023-08-29 NOTE — Progress Notes (Signed)
 Chief Complaint  Patient presents with   Wound Check    Pt is here to f/u on left foot due to ulcer on the great toe, he states that he thinks its almost healed, no other complaints.    Subjective:  73 y.o. male with PMHx left ankle trauma presenting today for follow-up evaluation of an ulcer to the left great toe.  Overall significant improvement.  He has been wearing the offloading pads that were dispensed last visit. He has been applying the mupirocin  ointment as instructed.   Past Medical History:  Diagnosis Date   Allergy    Depression    Hypertension    OSA (obstructive sleep apnea)    NPSG 06-26-94; RDI 35/hr   Osteoarthritis    Psoriasis    Sleep apnea    wears cpap   Traumatic closed displaced fracture of distal end of tibia with fibula, right, initial encounter 03/09/2018    Past Surgical History:  Procedure Laterality Date   COLONOSCOPY     NASAL TURBINATE REDUCTION  1973   ORIF TIBIA FRACTURE Left 03/10/2018   Procedure: OPEN REDUCTION INTERNAL FIXATION (ORIF) TIBIA/FIBULA FRACTURE;  Surgeon: Elsa Lonni SAUNDERS, MD;  Location: [facility] OR;  Service: Orthopedics;  Laterality: Left;   REPLACEMENT TOTAL KNEE  2009,2010   x 2   ROTATOR CUFF REPAIR  2009   left    No Known Allergies   LT great toe 01/22/2023   LT great toe 04/18/2023   Objective/Physical Exam General: The patient is alert and oriented x3 in no acute distress.  Dermatology:  Preulcerative callus persistent to the left great toe.  After debridement there is a very superficial shallow wound and partial skin thickness.  Healthy granular tissue.  No fibrotic tissue.  Periwound is callused.  No concern for infection  Vascular: Palpable pedal pulses bilaterally. No edema or erythema noted. Capillary refill within normal limits.  Neurological: Light touch and protective threshold diminished bilaterally.   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  Patient is  ambulatory.  Pes planovalgus deformity with excessive pressure to the great toe noted during ambulation  Radiographic exam LT foot and ankle 04/18/2023: Orthopedic hardware throughout the foot and ankle appears to be intact.  No complications from hardware noted.  No osseous erosions or concern for osteomyelitis.  Diffuse degenerative changes noted throughout the joints of the foot  Assessment: 1.  Ulcer left plantar great toe 2.  H/o MRSA+ 3.  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  History of multiple surgeries left foot including TCC   Plan of Care:  -Patient was evaluated. -Medically necessary excisional debridement including subcutaneous tissue was performed using a tissue nipper and a chisel blade. Excisional debridement of all the necrotic nonviable tissue down to healthy bleeding viable tissue was performed with post-debridement measurements same as pre-. -The wound was cleansed and dry sterile dressing applied. - Continue mupirocin  2% ointment to apply to the wound PRN - Continue tennis shoes with offloading felt dancers pads which seem to be helping significantly.   -Return to clinic 4 months routine footcare  Thresa EMERSON Sar, DPM [facility]  Dr. Thresa EMERSON Sar, DPM    [address], [place] [address]  Office [phone]  Fax [phone]

## 2023-08-29 NOTE — Telephone Encounter (Signed)
 I had suggested that either Dr Geronimo or Dr Theophilus take over long-term follow-up of Steven Martin's interstitial lung disease with history of asbestos exposure because experienced with this pattern and I will be retiring at the end of this year. They are members of this same group, so he would continue to come to thisw office.

## 2023-10-03 ENCOUNTER — Other Ambulatory Visit: Payer: Self-pay

## 2023-10-03 ENCOUNTER — Telehealth: Payer: Self-pay

## 2023-10-03 DIAGNOSIS — G4733 Obstructive sleep apnea (adult) (pediatric): Secondary | ICD-10-CM

## 2023-10-03 NOTE — Telephone Encounter (Signed)
 Copied from CRM #[id number]. Topic: Clinical - Order For Equipment >> Oct 02, 2023  4:36 PM Rilla B wrote: Reason for CRM:  Patient states he now uses [facility] for his supplies and they are requesting a new signed order.   New order placed w/ new DME   NFN

## 2023-10-05 NOTE — Telephone Encounter (Signed)
 Copied from CRM [id number]. Topic: General - Other >> Oct 04, 2023  2:53 PM Shona S wrote: Reason for CRM: [facility] calling because they have not received patient new order and is requesting for it to be sent via email. Email [email or web address] Fax [phone] Phon [phone]   Routing to [facility], order should have been sent to [facility] and not [facility].  Please see referral 8/6

## 2023-10-12 ENCOUNTER — Telehealth: Payer: Self-pay | Admitting: *Deleted

## 2023-10-12 NOTE — Telephone Encounter (Signed)
 Copied from CRM [id number]. Topic: Clinical - Order For Equipment >> Oct 10, 2023  2:54 PM Rozanna MATSU wrote: Reason for CRM: PT CALLING BACK STATED  [facility] STILL NEEDS DR NEYSA ENGLAND. >> Oct 12, 2023 10:54 AM Joesph PARAS wrote: Patient states that Jodeane was supposed to have gotten in contact with us  regarding this order but no communication documented for this at this time. Patient is requesting any kind of assistance to get this pushed through, as patient needs supplies and Jodeane is giving him the run around.  >> Oct 11, 2023  2:52 PM CMA Warren ORN wrote: According to note in referral order was received on 8/12 by DME  Amy, did you get any fax from [facility] for Dr NEYSA to sign on this pt

## 2023-10-17 NOTE — Telephone Encounter (Addendum)
 Called patient.  Patient has not heard from [facility] regarding his CPAP supplies request.  No CMN has been documented as received by [facility].  Looked on Dr. Saundra desk and mailbox today.  No CMN located.  Called [facility].  [phone]).  They stated they have patient in their system but they do not handle patient's ordering of supplies.  This is what [employer] is supposed to do.  Rep at [facility] stated she will fax a blank form to us  and we can fill out the form and fax back to the number provided on the form fax [phone]).  We can call the patient and ask what supplies he needs and check the boxes on the form to note exactly which supplies he needs.  Waiting on form to be faxed.

## 2023-10-17 NOTE — Telephone Encounter (Signed)
 Copied from CRM [id number]. Topic: Clinical - Order For Equipment >> Oct 10, 2023  2:54 PM Rozanna MATSU wrote: Reason for CRM: PT CALLING BACK STATED  [facility] STILL NEEDS DR NEYSA ENGLAND. >> Oct 17, 2023  2:37 PM Joesph PARAS wrote: Patient is calling to update practice regarding update from [facility] to him, stating they are still waiting on us . Informed that per note and conversation with [facility] earlier, we are awaiting a form to be faxed to us  for completion.  >> Oct 12, 2023 10:54 AM Joesph PARAS wrote: Patient states that Jodeane was supposed to have gotten in contact with us  regarding this order but no communication documented for this at this time. Patient is requesting any kind of assistance to get this pushed through, as patient needs supplies and Jodeane is giving him the run around.  >> Oct 11, 2023  2:52 PM CMA Warren ORN wrote: According to note in referral order was received on 8/12 by DME  Duplicate & per previous note from Amy CMA, we are waiting for fax

## 2023-10-18 NOTE — Telephone Encounter (Addendum)
 Received blank form from [employer].  Filled out demographic section.  Left message for patient to call.  Need to fill out section to document what supplies patient needs.  Once done, will get Dr. Neysa to sign and we can fax back to [facility].

## 2023-10-19 NOTE — Telephone Encounter (Addendum)
 Called patient.  Patient gave information as to what supplies he needs.  Filled out form.  Placed on Dr. Saundra desk in C POD.  Will fax to [facility] at [phone] once signed.  Faxed forms to [employer].

## 2024-01-02 ENCOUNTER — Ambulatory Visit: Admitting: Podiatry
